# Patient Record
Sex: Female | Born: 1955
Health system: Southern US, Community
[De-identification: ages and names within clinical notes are randomized; demographics above are authoritative.]

## PROBLEM LIST (undated history)

## (undated) DIAGNOSIS — H1010 Acute atopic conjunctivitis, unspecified eye: Secondary | ICD-10-CM

## (undated) DIAGNOSIS — N3281 Overactive bladder: Secondary | ICD-10-CM

## (undated) DIAGNOSIS — F988 Other specified behavioral and emotional disorders with onset usually occurring in childhood and adolescence: Secondary | ICD-10-CM

## (undated) DIAGNOSIS — R519 Headache, unspecified: Secondary | ICD-10-CM

## (undated) DIAGNOSIS — I10 Essential (primary) hypertension: Secondary | ICD-10-CM

## (undated) DIAGNOSIS — M545 Low back pain: Secondary | ICD-10-CM

## (undated) DIAGNOSIS — R32 Unspecified urinary incontinence: Secondary | ICD-10-CM

## (undated) DIAGNOSIS — Z0000 Encounter for general adult medical examination without abnormal findings: Principal | ICD-10-CM

## (undated) DIAGNOSIS — R51 Headache: Secondary | ICD-10-CM

## (undated) DIAGNOSIS — J209 Acute bronchitis, unspecified: Secondary | ICD-10-CM

## (undated) DIAGNOSIS — F418 Other specified anxiety disorders: Secondary | ICD-10-CM

## (undated) DIAGNOSIS — E782 Mixed hyperlipidemia: Secondary | ICD-10-CM

## (undated) DIAGNOSIS — F32A Depression, unspecified: Secondary | ICD-10-CM

## (undated) DIAGNOSIS — F329 Major depressive disorder, single episode, unspecified: Secondary | ICD-10-CM

## (undated) HISTORY — DX: Headache, unspecified: R51.9

## (undated) HISTORY — DX: Unspecified urinary incontinence: R32

## (undated) HISTORY — DX: Other specified behavioral and emotional disorders with onset usually occurring in childhood and adolescence: F98.8

## (undated) HISTORY — DX: Other specified anxiety disorders: F41.8

## (undated) HISTORY — DX: Major depressive disorder, single episode, unspecified: F32.9

## (undated) HISTORY — DX: Acute bronchitis, unspecified: J20.9

## (undated) HISTORY — DX: Overactive bladder: N32.81

## (undated) HISTORY — DX: Essential (primary) hypertension: I10

## (undated) HISTORY — DX: Headache: R51

## (undated) HISTORY — DX: Mixed hyperlipidemia: E78.2

## (undated) HISTORY — DX: Acute atopic conjunctivitis, unspecified eye: H10.10

## (undated) HISTORY — DX: Depression, unspecified: F32.A

## (undated) HISTORY — DX: Low back pain: M54.5

## (undated) HISTORY — DX: Encounter for general adult medical examination without abnormal findings: Z00.00

---

## 2003-11-10 ENCOUNTER — Ambulatory Visit (HOSPITAL_COMMUNITY): Admission: RE | Admit: 2003-11-10 | Discharge: 2003-11-10 | Payer: Self-pay | Admitting: Gastroenterology

## 2004-03-05 ENCOUNTER — Other Ambulatory Visit: Admission: RE | Admit: 2004-03-05 | Discharge: 2004-03-05 | Payer: Self-pay | Admitting: Family Medicine

## 2004-08-22 HISTORY — PX: BREAST BIOPSY: SHX20

## 2005-04-06 ENCOUNTER — Other Ambulatory Visit: Admission: RE | Admit: 2005-04-06 | Discharge: 2005-04-06 | Payer: Self-pay | Admitting: Family Medicine

## 2005-11-15 ENCOUNTER — Encounter: Admission: RE | Admit: 2005-11-15 | Discharge: 2005-11-15 | Payer: Self-pay | Admitting: Family Medicine

## 2007-01-30 ENCOUNTER — Other Ambulatory Visit: Admission: RE | Admit: 2007-01-30 | Discharge: 2007-01-30 | Payer: Self-pay | Admitting: Family Medicine

## 2010-05-18 ENCOUNTER — Ambulatory Visit: Payer: Self-pay | Admitting: Family Medicine

## 2010-05-18 DIAGNOSIS — M25569 Pain in unspecified knee: Secondary | ICD-10-CM | POA: Insufficient documentation

## 2010-09-21 NOTE — Progress Notes (Signed)
Summary: Office Visit Notes  Office Visit Notes   Imported By: Darius Bump 07/08/2010 09:56:46  _____________________________________________________________________  External Attachment:    Type:   Image     Comment:   External Document

## 2010-09-21 NOTE — Miscellaneous (Signed)
Summary: Informed Consent Form  Informed Consent Form   Imported By: Darius Bump 07/08/2010 10:00:00  _____________________________________________________________________  External Attachment:    Type:   Image     Comment:   External Document

## 2010-09-21 NOTE — Assessment & Plan Note (Signed)
Summary: NP KNEE PAIN/NEGATIVE XRAYS   Vital Signs:  Patient profile:   55 year old female Height:      63 inches Weight:      144 pounds BMI:     25.60  Requesting Provider:  Dr. Luz Brazen Primary Care Provider:  Dr. Luz Brazen   History of Present Illness: 55 yo F here with left knee pain  Patient reports no known injury Started developing knee pain about 2 weeks ago that has worsened since then Feels more medially than laterally Associated with swelling in front and back of knee. Stiff especially when using stairs (downstairs) and sitting for prolonged periods. No prior knee issues. No true catching or locking. Has given out on her. Tried aleve once. Has a knee sleeve as well that felt pretty good. Radiographs standing views of left knee with sunrise show no evidence of DJD or other bony abnormalities.  Problems Prior to Update: None  Medications Prior to Update: 1)  None  Allergies (verified): 1)  ! * Latex 2)  ! 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  No bruising or erythema.  Fullness posteriorly but no obvious bakers cyst. TTP medial > lateral joint line reproducing pain. FROM but pain at full flexion and 'feels tight' Negative ant/post drawer.  neg lachmanns Stable to valgus and varus stress. Mcmurrays + medially. 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  X-ray report (weightbearing films) without DJD.  Will try conservative approach given she does not  have any mechanical symptoms (catching/locking) with full extension.  given intraarticular cortisone injection today after written consent.  To start aleve twice a day with food.  Ice knee 3-4 times a day.  Knee brace for support and compression provided.  Quad sets and straight leg raises to maintain quad strength and help unload joint.  Handout also provided and gone over with patient.  Answered all questions.  Discussed that if she does not improve over the next few weeks she should call us and we will set up an MRI of her knee to assess meniscus and, if present, refer for discussion of arthroscopic surgery.  After informed written consent patient was seated on the exam table and left knee was prepped with alcohol swab.  Utilizing an anterolateral approach left knee was injected intraarticularly with 1:3 kenalog:marcaine.  Patient tolerated the procedure well without any immediate complications.  Orders: Joint Aspirate / Injection, Large (20610) Patella / Knee brace (G9562)  Patient Instructions: 1)  Your exam is consistent with a degenerative meniscal tear in your left knee. 2)  You were given a cortisone shot in your knee for pain and inflammation. 3)  Ice your knee 3-4 times a day for 15 minutes at a time. 4)  Take aleve 2 tabs twice a day with food for pain and inflammation. 5)  Wear knee brace when up and walking around for support. 6)  If not improving over the next 3-4 weeks, call me - we will do an MRI if this does not calm down. 7)  Try to  minimize squatting, twisting as much as possible over the next month. 8)  Otherwise ok to do activities as tolerated.

## 2011-01-07 NOTE — Op Note (Signed)
NAME:  MISHAL, PROBERT                       ACCOUNT NO.:  [id number]   MEDICAL RECORD NO.:  [id number]                   PATIENT TYPE:  AMB   LOCATION:  ENDO                                 FACILITY[facility]  [facility]   PHYSICIAN:  Danise Edge, M.D.                DATE OF BIRTH:  1956-06-16   DATE OF PROCEDURE:  11/10/2003  DATE OF DISCHARGE:                                 OPERATIVE REPORT   PROCEDURE:  Diagnostic colonoscopy.   INDICATIONS:  Mrs. Ziggy Reveles is 55 year old female, born April 02, 1956.  Shortly after ordering and consuming an energy drink advertised on  television, Mrs. Fournet developed constipation.  Her normal bowel movement  pattern is one bowel movement a day, usually after her breakfast with  coffee.  She is now having a bowel movement every third day associated with  occasional urges to have a bowel movement without actually passing stool.  She was experiencing abdominal bloating and anorectal pain without  gastrointestinal bleeding.   Mrs. Gravlin's mother was diagnosed colon cancer at age 38 and her 53-year-  old sister was diagnosed with colon cancer.  A sister two years older than  Mrs. Scritchfield did not have colon polyps or colon cancer.  Her younger sister  had colon polyps removed.   ALLERGIES:  Shellfish.   CURRENT MEDICATIONS:  MiraLax.   PAST MEDICAL HISTORY:  1. Acne.  2. Hospitalization for childbirth on three occasions.   HABITS:  Mrs. Byington does not smoke cigarettes or consume alcohol.   SOCIAL HISTORY:  Mrs. Saia's 23 year old husband is in good health.  Her  two daughters and son are in good health.   ENDOSCOPIST:  Danise Edge, M.D.   PREMEDICATION:  Versed 10 mg, Demerol 70 mg.   DESCRIPTION OF PROCEDURE:  After obtaining informed consent, Mrs. Blatchford was  placed in the left lateral decubitus position.  I administered intravenous  Demerol and intravenous Versed to achieve conscious sedation for the  procedure.  The patient's  blood pressure, oxygen saturation and cardiac  rhythm were monitored throughout the procedure and documented in the medical  record.   Anal inspection was normal.  Digital rectal exam was normal.  The Olympus  adjustable pediatric colonoscope was introduced into the rectum and advanced  to the cecum.  Colonic preparation for the exam today was excellent.   Rectum:  Normal.  Sigmoid colon and descending colon:  Normal.  Splenic flexure:  Normal.  Transverse colon:  Normal.  Hepatic flexure:  Normal.  Ascending colon:  Normal.  Cecum and ileocecal valve:  Normal.   ASSESSMENT:  Normal screening proctocolonoscopy to the cecum.  No endoscopic  evidence for the presence of colorectal  neoplasia.   RECOMMENDATIONS:  Repeat colonoscopy in five years.  Continue MiraLax daily  to prevent constipation.  Danise Edge, M.D.    MJ/MEDQ  D:  11/10/2003  T:  11/10/2003  Job:  [id number]   cc:   Vikki Ports, M.D.  [address]  [place]  [place] [address]  Fax: [phone]

## 2011-04-12 ENCOUNTER — Emergency Department (HOSPITAL_COMMUNITY)
Admission: EM | Admit: 2011-04-12 | Discharge: 2011-04-12 | Disposition: A | Discharge status: Home / Self Care | Payer: BC Managed Care – PPO | Attending: Emergency Medicine | Admitting: Emergency Medicine

## 2011-04-12 ENCOUNTER — Emergency Department (HOSPITAL_COMMUNITY): Payer: BC Managed Care – PPO

## 2011-04-12 DIAGNOSIS — R079 Chest pain, unspecified: Secondary | ICD-10-CM | POA: Insufficient documentation

## 2011-04-12 LAB — CBC
HCT: 43.2 % (ref 36.0–46.0)
Hemoglobin: 14.1 g/dL (ref 12.0–15.0)
MCH: 27.3 pg (ref 26.0–34.0)
MCHC: 32.6 g/dL (ref 30.0–36.0)
Platelets: 233 10*3/uL (ref 150–400)
RDW: 13.6 % (ref 11.5–15.5)
WBC: 6.5 10*3/uL (ref 4.0–10.5)

## 2011-04-12 LAB — D-DIMER, QUANTITATIVE: D-Dimer, Quant: <0.22 ug/mL-FEU (ref 0.00–0.48)

## 2011-04-12 LAB — BASIC METABOLIC PANEL
Creatinine, Ser: 0.82 mg/dL (ref 0.50–1.10)
Sodium: 137 mEq/L (ref 135–145)

## 2011-04-12 LAB — DIFFERENTIAL
Basophils Absolute: 0 10*3/uL (ref 0.0–0.1)
Eosinophils Absolute: 0.2 10*3/uL (ref 0.0–0.7)
Eosinophils Relative: 3 % (ref 0–5)
Monocytes Absolute: 0.7 10*3/uL (ref 0.1–1.0)

## 2011-04-12 LAB — POCT I-STAT TROPONIN I

## 2011-12-20 ENCOUNTER — Other Ambulatory Visit: Payer: Self-pay | Admitting: Family Medicine

## 2011-12-20 DIAGNOSIS — Z1231 Encounter for screening mammogram for malignant neoplasm of breast: Secondary | ICD-10-CM

## 2011-12-26 ENCOUNTER — Ambulatory Visit
Admission: RE | Admit: 2011-12-26 | Discharge: 2011-12-26 | Disposition: A | Discharge status: Home / Self Care | Payer: Commercial Indemnity | Source: Ambulatory Visit | Attending: Family Medicine | Admitting: Family Medicine

## 2011-12-26 DIAGNOSIS — Z1231 Encounter for screening mammogram for malignant neoplasm of breast: Secondary | ICD-10-CM

## 2013-10-31 ENCOUNTER — Encounter (HOSPITAL_COMMUNITY): Payer: Self-pay | Admitting: Emergency Medicine

## 2013-10-31 ENCOUNTER — Emergency Department (HOSPITAL_COMMUNITY): Payer: Commercial Indemnity

## 2013-10-31 ENCOUNTER — Emergency Department (HOSPITAL_COMMUNITY)
Admission: EM | Admit: 2013-10-31 | Discharge: 2013-10-31 | Disposition: A | Discharge status: Home / Self Care | Payer: Commercial Indemnity | Attending: Emergency Medicine | Admitting: Emergency Medicine

## 2013-10-31 DIAGNOSIS — Z880 Allergy status to penicillin: Secondary | ICD-10-CM | POA: Insufficient documentation

## 2013-10-31 DIAGNOSIS — S161XXA Strain of muscle, fascia and tendon at neck level, initial encounter: Secondary | ICD-10-CM

## 2013-10-31 DIAGNOSIS — S335XXA Sprain of ligaments of lumbar spine, initial encounter: Secondary | ICD-10-CM | POA: Insufficient documentation

## 2013-10-31 DIAGNOSIS — Z91040 Latex allergy status: Secondary | ICD-10-CM | POA: Insufficient documentation

## 2013-10-31 DIAGNOSIS — Y92410 Unspecified street and highway as the place of occurrence of the external cause: Secondary | ICD-10-CM | POA: Insufficient documentation

## 2013-10-31 DIAGNOSIS — S301XXA Contusion of abdominal wall, initial encounter: Secondary | ICD-10-CM | POA: Insufficient documentation

## 2013-10-31 DIAGNOSIS — Z79899 Other long term (current) drug therapy: Secondary | ICD-10-CM | POA: Insufficient documentation

## 2013-10-31 DIAGNOSIS — S139XXA Sprain of joints and ligaments of unspecified parts of neck, initial encounter: Secondary | ICD-10-CM | POA: Insufficient documentation

## 2013-10-31 DIAGNOSIS — S39012A Strain of muscle, fascia and tendon of lower back, initial encounter: Secondary | ICD-10-CM

## 2013-10-31 DIAGNOSIS — Y9389 Activity, other specified: Secondary | ICD-10-CM | POA: Insufficient documentation

## 2013-10-31 LAB — CBC WITH DIFFERENTIAL/PLATELET
BASOS PCT: 1 % (ref 0–1)
Basophils Absolute: 0 10*3/uL (ref 0.0–0.1)
Eosinophils Absolute: 0 10*3/uL (ref 0.0–0.7)
Eosinophils Relative: 1 % (ref 0–5)
HEMATOCRIT: 43 % (ref 36.0–46.0)
HEMOGLOBIN: 14.8 g/dL (ref 12.0–15.0)
LYMPHS PCT: 45 % (ref 12–46)
Lymphs Abs: 2.1 10*3/uL (ref 0.7–4.0)
MCH: 28.2 pg (ref 26.0–34.0)
MCHC: 34.4 g/dL (ref 30.0–36.0)
MCV: 81.9 fL (ref 78.0–100.0)
Monocytes Absolute: 0.3 10*3/uL (ref 0.1–1.0)
Monocytes Relative: 7 % (ref 3–12)
NEUTROS PCT: 46 % (ref 43–77)
Neutro Abs: 2.1 10*3/uL (ref 1.7–7.7)
Platelets: 215 10*3/uL (ref 150–400)
RBC: 5.25 MIL/uL — AB (ref 3.87–5.11)
RDW: 13 % (ref 11.5–15.5)
WBC: 4.6 10*3/uL (ref 4.0–10.5)

## 2013-10-31 LAB — I-STAT CHEM 8, ED
BUN: 7 mg/dL (ref 6–23)
CHLORIDE: 102 meq/L (ref 96–112)
CREATININE: 0.9 mg/dL (ref 0.50–1.10)
Calcium, Ion: 1.24 mmol/L — ABNORMAL HIGH (ref 1.12–1.23)
GLUCOSE: 93 mg/dL (ref 70–99)
HCT: 49 % — ABNORMAL HIGH (ref 36.0–46.0)
Hemoglobin: 16.7 g/dL — ABNORMAL HIGH (ref 12.0–15.0)
Potassium: 3.8 mEq/L (ref 3.7–5.3)
SODIUM: 143 meq/L (ref 137–147)
TCO2: 27 mmol/L (ref 0–100)

## 2013-10-31 MED ORDER — ONDANSETRON HCL 4 MG/2ML IJ SOLN
4.0000 mg | Freq: Once | INTRAMUSCULAR | Status: AC
  Administered 2013-10-31: 4 mg via INTRAVENOUS
  Filled 2013-10-31: qty 2

## 2013-10-31 MED ORDER — CYCLOBENZAPRINE HCL 10 MG PO TABS: 10.0000 mg | ORAL_TABLET | Freq: Two times a day (BID) | ORAL | Status: DC | PRN

## 2013-10-31 MED ORDER — IBUPROFEN 600 MG PO TABS: 600.0000 mg | ORAL_TABLET | Freq: Four times a day (QID) | ORAL | Status: DC | PRN

## 2013-10-31 MED ORDER — HYDROCODONE-ACETAMINOPHEN 5-325 MG PO TABS: 1.0000 | ORAL_TABLET | Freq: Four times a day (QID) | ORAL | Status: DC | PRN

## 2013-10-31 MED ORDER — MORPHINE SULFATE 4 MG/ML IJ SOLN
4.0000 mg | Freq: Once | INTRAMUSCULAR | Status: AC
  Administered 2013-10-31: 4 mg via INTRAVENOUS
  Filled 2013-10-31: qty 1

## 2013-10-31 MED ORDER — IOHEXOL 300 MG/ML  SOLN
100.0000 mL | Freq: Once | INTRAMUSCULAR | Status: AC | PRN
  Administered 2013-10-31: 100 mL via INTRAVENOUS
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
## 2013-11-01 NOTE — ED Provider Notes (Signed)
Medical screening examination/treatment/procedure(s) were performed by non-physician practitioner and as supervising physician I was immediately available for consultation/collaboration.   EKG Interpretation None        Suzanne ArgyleForrest S Rogerick Baldwin, MD 11/01/13 [phone]

## 2014-05-13 ENCOUNTER — Encounter: Payer: Self-pay | Admitting: Women's Health

## 2014-05-13 ENCOUNTER — Other Ambulatory Visit (HOSPITAL_COMMUNITY)
Admission: RE | Admit: 2014-05-13 | Discharge: 2014-05-13 | Disposition: A | Discharge status: Home / Self Care | Payer: Commercial Indemnity | Source: Ambulatory Visit | Attending: Women's Health | Admitting: Women's Health

## 2014-05-13 ENCOUNTER — Ambulatory Visit (INDEPENDENT_AMBULATORY_CARE_PROVIDER_SITE_OTHER): Payer: Managed Care, Other (non HMO) | Admitting: Women's Health

## 2014-05-13 VITALS — BP 140/84 | Ht 63.0 in | Wt 140.0 lb

## 2014-05-13 DIAGNOSIS — Z113 Encounter for screening for infections with a predominantly sexual mode of transmission: Secondary | ICD-10-CM

## 2014-05-13 DIAGNOSIS — Z01419 Encounter for gynecological examination (general) (routine) without abnormal findings: Secondary | ICD-10-CM | POA: Diagnosis present

## 2014-05-13 DIAGNOSIS — Z780 Asymptomatic menopausal state: Secondary | ICD-10-CM

## 2014-05-13 DIAGNOSIS — E079 Disorder of thyroid, unspecified: Secondary | ICD-10-CM

## 2014-05-13 DIAGNOSIS — R109 Unspecified abdominal pain: Secondary | ICD-10-CM

## 2014-05-13 DIAGNOSIS — E559 Vitamin D deficiency, unspecified: Secondary | ICD-10-CM

## 2014-05-13 DIAGNOSIS — Z1151 Encounter for screening for human papillomavirus (HPV): Secondary | ICD-10-CM | POA: Insufficient documentation

## 2014-05-13 NOTE — Patient Instructions (Signed)
Health Recommendations for Postmenopausal Women Respected and ongoing research has looked at the most common causes of death, disability, and poor quality of life in postmenopausal women. The causes include heart disease, diseases of blood vessels, diabetes, depression, cancer, and bone loss (osteoporosis). Many things can be done to help lower the chances of developing these and other common problems. CARDIOVASCULAR DISEASE Heart Disease: A heart attack is a medical emergency. Know the signs and symptoms of a heart attack. Below are things women can do to reduce their risk for heart disease.   Do not smoke. If you smoke, quit.  Aim for a healthy weight. Being overweight causes many preventable deaths. Eat a healthy and balanced diet and drink an adequate amount of liquids.  Get moving. Make a commitment to be more physically active. Aim for 30 minutes of activity on most, if not all days of the week.  Eat for heart health. Choose a diet that is low in saturated fat and cholesterol and eliminate trans fat. Include whole grains, vegetables, and fruits. Read and understand the labels on food containers before buying.  Know your numbers. Ask your caregiver to check your blood pressure, cholesterol (total, HDL, LDL, triglycerides) and blood glucose. Work with your caregiver on improving your entire clinical picture.  High blood pressure. Limit or stop your table salt intake (try salt substitute and food seasonings). Avoid salty foods and drinks. Read labels on food containers before buying. Eating well and exercising can help control high blood pressure. STROKE  Stroke is a medical emergency. Stroke may be the result of a blood clot in a blood vessel in the brain or by a brain hemorrhage (bleeding). Know the signs and symptoms of a stroke. To lower the risk of developing a stroke:  Avoid fatty foods.  Quit smoking.  Control your diabetes, blood pressure, and irregular heart rate. THROMBOPHLEBITIS  (BLOOD CLOT) OF THE LEG  Becoming overweight and leading a stationary lifestyle may also contribute to developing blood clots. Controlling your diet and exercising will help lower the risk of developing blood clots. CANCER SCREENING  Breast Cancer: Take steps to reduce your risk of breast cancer.  You should practice "breast self-awareness." This means understanding the normal appearance and feel of your breasts and should include breast self-examination. Any changes detected, no matter how small, should be reported to your caregiver.  After age 40, you should have a clinical breast exam (CBE) every year.  Starting at age 40, you should consider having a mammogram (breast X-ray) every year.  If you have a family history of breast cancer, talk to your caregiver about genetic screening.  If you are at high risk for breast cancer, talk to your caregiver about having an MRI and a mammogram every year.  Intestinal or Stomach Cancer: Tests to consider are a rectal exam, fecal occult blood, sigmoidoscopy, and colonoscopy. Women who are high risk may need to be screened at an earlier age and more often.  Cervical Cancer:  Beginning at age 30, you should have a Pap test every 3 years as long as the past 3 Pap tests have been normal.  If you have had past treatment for cervical cancer or a condition that could lead to cancer, you need Pap tests and screening for cancer for at least 20 years after your treatment.  If you had a hysterectomy for a problem that was not cancer or a condition that could lead to cancer, then you no longer need Pap tests.    If you are between ages 65 and 70, and you have had normal Pap tests going back 10 years, you no longer need Pap tests.  If Pap tests have been discontinued, risk factors (such as a new sexual partner) need to be reassessed to determine if screening should be resumed.  Some medical problems can increase the chance of getting cervical cancer. In these  cases, your caregiver may recommend more frequent screening and Pap tests.  Uterine Cancer: If you have vaginal bleeding after reaching menopause, you should notify your caregiver.  Ovarian Cancer: Other than yearly pelvic exams, there are no reliable tests available to screen for ovarian cancer at this time except for yearly pelvic exams.  Lung Cancer: Yearly chest X-rays can detect lung cancer and should be done on high risk women, such as cigarette smokers and women with chronic lung disease (emphysema).  Skin Cancer: A complete body skin exam should be done at your yearly examination. Avoid overexposure to the sun and ultraviolet light lamps. Use a strong sun block cream when in the sun. All of these things are important for lowering the risk of skin cancer. MENOPAUSE Menopause Symptoms: Hormone therapy products are effective for treating symptoms associated with menopause:  Moderate to severe hot flashes.  Night sweats.  Mood swings.  Headaches.  Tiredness.  Loss of sex drive.  Insomnia.  Other symptoms. Hormone replacement carries certain risks, especially in older women. Women who use or are thinking about using estrogen or estrogen with progestin treatments should discuss that with their caregiver. Your caregiver will help you understand the benefits and risks. The ideal dose of hormone replacement therapy is not known. The Food and Drug Administration (FDA) has concluded that hormone therapy should be used only at the lowest doses and for the shortest amount of time to reach treatment goals.  OSTEOPOROSIS Protecting Against Bone Loss and Preventing Fracture If you use hormone therapy for prevention of bone loss (osteoporosis), the risks for bone loss must outweigh the risk of the therapy. Ask your caregiver about other medications known to be safe and effective for preventing bone loss and fractures. To guard against bone loss or fractures, the following is recommended:  If  you are younger than age 50, take 1000 mg of calcium and at least 600 mg of Vitamin D per day.  If you are older than age 50 but younger than age 70, take 1200 mg of calcium and at least 600 mg of Vitamin D per day.  If you are older than age 70, take 1200 mg of calcium and at least 800 mg of Vitamin D per day. Smoking and excessive alcohol intake increases the risk of osteoporosis. Eat foods rich in calcium and vitamin D and do weight bearing exercises several times a week as your caregiver suggests. DIABETES Diabetes Mellitus: If you have type I or type 2 diabetes, you should keep your blood sugar under control with diet, exercise, and recommended medication. Avoid starchy and fatty foods, and too many sweets. Being overweight can make diabetes control more difficult. COGNITION AND MEMORY Cognition and Memory: Menopausal hormone therapy is not recommended for the prevention of cognitive disorders such as Alzheimer's disease or memory loss.  DEPRESSION  Depression may occur at any age, but it is common in elderly women. This may be because of physical, medical, social (loneliness), or financial problems and needs. If you are experiencing depression because of medical problems and control of symptoms, talk to your caregiver about this. Physical   activity and exercise may help with mood and sleep. Community and volunteer involvement may improve your sense of value and worth. If you have depression and you feel that the problem is getting worse or becoming severe, talk to your caregiver about which treatment options are best for you. ACCIDENTS  Accidents are common and can be serious in elderly woman. Prepare your house to prevent accidents. Eliminate throw rugs, place hand bars in bath, shower, and toilet areas. Avoid wearing high heeled shoes or walking on wet, snowy, and icy areas. Limit or stop driving if you have vision or hearing problems, or if you feel you are unsteady with your movements and  reflexes. HEPATITIS C Hepatitis C is a type of viral infection affecting the liver. It is spread mainly through contact with blood from an infected person. It can be treated, but if left untreated, it can lead to severe liver damage over the years. Many people who are infected do not know that the virus is in their blood. If you are a "baby-boomer", it is recommended that you have one screening test for Hepatitis C. IMMUNIZATIONS  Several immunizations are important to consider having during your senior years, including:   Tetanus, diphtheria, and pertussis booster shot.  Influenza every year before the flu season begins.  Pneumonia vaccine.  Shingles vaccine.  Others, as indicated based on your specific needs. Talk to your caregiver about these. Document Released: 09/30/2005 Document Revised: 12/23/2013 Document Reviewed: 05/26/2008 [employer] Patient Information 2015 [employer]. This information is not intended to replace advice given to you by your health care provider. Make sure you discuss any questions you have with your health care provider.  
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
## 2014-05-14 LAB — VITAMIN D 25 HYDROXY (VIT D DEFICIENCY, FRACTURES): Vit D, 25-Hydroxy: 29 ng/mL — ABNORMAL LOW (ref 30–89)

## 2014-05-14 LAB — GC/CHLAMYDIA PROBE AMP
CT PROBE, AMP APTIMA: NEGATIVE
GC PROBE AMP APTIMA: NEGATIVE

## 2014-05-14 LAB — RPR

## 2014-05-14 LAB — HEPATITIS C ANTIBODY: HCV AB: NEGATIVE

## 2014-05-14 LAB — HIV ANTIBODY (ROUTINE TESTING W REFLEX): HIV: NONREACTIVE

## 2014-05-14 LAB — HEPATITIS B SURFACE ANTIGEN: Hepatitis B Surface Ag: NEGATIVE

## 2014-05-15 LAB — CYTOLOGY - PAP

## 2014-05-16 ENCOUNTER — Other Ambulatory Visit: Payer: Self-pay | Admitting: *Deleted

## 2014-05-16 DIAGNOSIS — E559 Vitamin D deficiency, unspecified: Secondary | ICD-10-CM

## 2014-05-16 MED ORDER — VITAMIN D (ERGOCALCIFEROL) 1.25 MG (50000 UNIT) PO CAPS: 50000.0000 [IU] | ORAL_CAPSULE | ORAL | Status: DC

## 2014-12-01 ENCOUNTER — Encounter: Payer: Self-pay | Admitting: Family Medicine

## 2014-12-01 ENCOUNTER — Ambulatory Visit: Payer: Commercial Indemnity | Admitting: Family Medicine

## 2014-12-01 ENCOUNTER — Ambulatory Visit (HOSPITAL_BASED_OUTPATIENT_CLINIC_OR_DEPARTMENT_OTHER)
Admission: RE | Admit: 2014-12-01 | Discharge: 2014-12-01 | Disposition: A | Discharge status: Home / Self Care | Payer: Commercial Indemnity | Source: Ambulatory Visit | Attending: Family Medicine | Admitting: Family Medicine

## 2014-12-01 ENCOUNTER — Ambulatory Visit (INDEPENDENT_AMBULATORY_CARE_PROVIDER_SITE_OTHER): Payer: Commercial Indemnity | Admitting: Family Medicine

## 2014-12-01 VITALS — BP 114/80 | HR 61 | Temp 97.7°F | Ht 62.5 in | Wt 146.0 lb

## 2014-12-01 DIAGNOSIS — R32 Unspecified urinary incontinence: Secondary | ICD-10-CM

## 2014-12-01 DIAGNOSIS — I10 Essential (primary) hypertension: Secondary | ICD-10-CM

## 2014-12-01 DIAGNOSIS — R51 Headache: Secondary | ICD-10-CM

## 2014-12-01 DIAGNOSIS — M5032 Other cervical disc degeneration, mid-cervical region: Secondary | ICD-10-CM | POA: Diagnosis not present

## 2014-12-01 DIAGNOSIS — N760 Acute vaginitis: Secondary | ICD-10-CM

## 2014-12-01 DIAGNOSIS — N390 Urinary tract infection, site not specified: Secondary | ICD-10-CM | POA: Diagnosis not present

## 2014-12-01 DIAGNOSIS — F988 Other specified behavioral and emotional disorders with onset usually occurring in childhood and adolescence: Secondary | ICD-10-CM

## 2014-12-01 DIAGNOSIS — M5033 Other cervical disc degeneration, cervicothoracic region: Secondary | ICD-10-CM | POA: Diagnosis not present

## 2014-12-01 DIAGNOSIS — R519 Headache, unspecified: Secondary | ICD-10-CM

## 2014-12-01 DIAGNOSIS — F418 Other specified anxiety disorders: Secondary | ICD-10-CM

## 2014-12-01 DIAGNOSIS — F909 Attention-deficit hyperactivity disorder, unspecified type: Secondary | ICD-10-CM

## 2014-12-01 DIAGNOSIS — R202 Paresthesia of skin: Secondary | ICD-10-CM | POA: Diagnosis not present

## 2014-12-01 DIAGNOSIS — M542 Cervicalgia: Secondary | ICD-10-CM | POA: Diagnosis not present

## 2014-12-01 LAB — URINALYSIS, ROUTINE W REFLEX MICROSCOPIC
Bilirubin Urine: NEGATIVE
Hgb urine dipstick: NEGATIVE
Ketones, ur: NEGATIVE
NITRITE: NEGATIVE
Specific Gravity, Urine: 1.02 (ref 1.000–1.030)
Total Protein, Urine: NEGATIVE
Urine Glucose: NEGATIVE
Urobilinogen, UA: 0.2 (ref 0.0–1.0)
pH: 6.5 (ref 5.0–8.0)

## 2014-12-01 LAB — VITAMIN B12: Vitamin B-12: 750 pg/mL (ref 211–911)

## 2014-12-01 MED ORDER — OXYBUTYNIN CHLORIDE 5 MG PO TABS: 5.0000 mg | ORAL_TABLET | Freq: Every day | ORAL | Status: DC | PRN

## 2014-12-01 MED ORDER — AMPHETAMINE-DEXTROAMPHETAMINE 5 MG PO TABS: 5.0000 mg | ORAL_TABLET | Freq: Two times a day (BID) | ORAL | Status: DC

## 2014-12-01 MED ORDER — HYDROCHLOROTHIAZIDE 12.5 MG PO CAPS: 12.5000 mg | ORAL_CAPSULE | ORAL | Status: DC | PRN

## 2014-12-01 MED ORDER — CITALOPRAM HYDROBROMIDE 40 MG PO TABS: 40.0000 mg | ORAL_TABLET | Freq: Every day | ORAL | Status: DC

## 2014-12-01 MED ORDER — CITALOPRAM HYDROBROMIDE 20 MG PO TABS: 20.0000 mg | ORAL_TABLET | Freq: Every day | ORAL | Status: DC

## 2014-12-01 MED ORDER — CYCLOBENZAPRINE HCL 10 MG PO TABS: 10.0000 mg | ORAL_TABLET | Freq: Two times a day (BID) | ORAL | Status: DC | PRN

## 2014-12-01 NOTE — Patient Instructions (Signed)
Preventive Care for Adults A healthy lifestyle and preventive care can promote health and wellness. Preventive health guidelines for women include the following key practices.  A routine yearly physical is a good way to check with your health care provider about your health and preventive screening. It is a chance to share any concerns and updates on your health and to receive a thorough exam.  Visit your dentist for a routine exam and preventive care every 6 months. Brush your teeth twice a day and floss once a day. Good oral hygiene prevents tooth decay and gum disease.  The frequency of eye exams is based on your age, health, family medical history, use of contact lenses, and other factors. Follow your health care provider's recommendations for frequency of eye exams.  Eat a healthy diet. Foods like vegetables, fruits, whole grains, low-fat dairy products, and lean protein foods contain the nutrients you need without too many calories. Decrease your intake of foods high in solid fats, added sugars, and salt. Eat the right amount of calories for you.Get information about a proper diet from your health care provider, if necessary.  Regular physical exercise is one of the most important things you can do for your health. Most adults should get at least 150 minutes of moderate-intensity exercise (any activity that increases your heart rate and causes you to sweat) each week. In addition, most adults need muscle-strengthening exercises on 2 or more days a week.  Maintain a healthy weight. The body mass index (BMI) is a screening tool to identify possible weight problems. It provides an estimate of body fat based on height and weight. Your health care provider can find your BMI and can help you achieve or maintain a healthy weight.For adults 20 years and older:  A BMI below 18.5 is considered underweight.  A BMI of 18.5 to 24.9 is normal.  A BMI of 25 to 29.9 is considered overweight.  A BMI of  30 and above is considered obese.  Maintain normal blood lipids and cholesterol levels by exercising and minimizing your intake of saturated fat. Eat a balanced diet with plenty of fruit and vegetables. Blood tests for lipids and cholesterol should begin at age 76 and be repeated every 5 years. If your lipid or cholesterol levels are high, you are over 50, or you are at high risk for heart disease, you may need your cholesterol levels checked more frequently.Ongoing high lipid and cholesterol levels should be treated with medicines if diet and exercise are not working.  If you smoke, find out from your health care provider how to quit. If you do not use tobacco, do not start.  Lung cancer screening is recommended for adults aged 22-80 years who are at high risk for developing lung cancer because of a history of smoking. A yearly low-dose CT scan of the lungs is recommended for people who have at least a 30-pack-year history of smoking and are a current smoker or have quit within the past 15 years. A pack year of smoking is smoking an average of 1 pack of cigarettes a day for 1 year (for example: 1 pack a day for 30 years or 2 packs a day for 15 years). Yearly screening should continue until the smoker has stopped smoking for at least 15 years. Yearly screening should be stopped for people who develop a health problem that would prevent them from having lung cancer treatment.  If you are pregnant, do not drink alcohol. If you are breastfeeding,  be very cautious about drinking alcohol. If you are not pregnant and choose to drink alcohol, do not have more than 1 drink per day. One drink is considered to be 12 ounces (355 mL) of beer, 5 ounces (148 mL) of wine, or 1.5 ounces (44 mL) of liquor.  Avoid use of street drugs. Do not share needles with anyone. Ask for help if you need support or instructions about stopping the use of drugs.  High blood pressure causes heart disease and increases the risk of  stroke. Your blood pressure should be checked at least every 1 to 2 years. Ongoing high blood pressure should be treated with medicines if weight loss and exercise do not work.  If you are 27-57 years old, ask your health care provider if you should take aspirin to prevent strokes.  Diabetes screening involves taking a blood sample to check your fasting blood sugar level. This should be done once every 3 years, after age 53, if you are within normal weight and without risk factors for diabetes. Testing should be considered at a younger age or be carried out more frequently if you are overweight and have at least 1 risk factor for diabetes.  Breast cancer screening is essential preventive care for women. You should practice "breast self-awareness." This means understanding the normal appearance and feel of your breasts and may include breast self-examination. Any changes detected, no matter how small, should be reported to a health care provider. Women in their 78s and 30s should have a clinical breast exam (CBE) by a health care provider as part of a regular health exam every 1 to 3 years. After age 77, women should have a CBE every year. Starting at age 22, women should consider having a mammogram (breast X-ray test) every year. Women who have a family history of breast cancer should talk to their health care provider about genetic screening. Women at a high risk of breast cancer should talk to their health care providers about having an MRI and a mammogram every year.  Breast cancer gene (BRCA)-related cancer risk assessment is recommended for women who have family members with BRCA-related cancers. BRCA-related cancers include breast, ovarian, tubal, and peritoneal cancers. Having family members with these cancers may be associated with an increased risk for harmful changes (mutations) in the breast cancer genes BRCA1 and BRCA2. Results of the assessment will determine the need for genetic counseling and  BRCA1 and BRCA2 testing.  Routine pelvic exams to screen for cancer are no longer recommended for nonpregnant women who are considered low risk for cancer of the pelvic organs (ovaries, uterus, and vagina) and who do not have symptoms. Ask your health care provider if a screening pelvic exam is right for you.  If you have had past treatment for cervical cancer or a condition that could lead to cancer, you need Pap tests and screening for cancer for at least 20 years after your treatment. If Pap tests have been discontinued, your risk factors (such as having a new sexual partner) need to be reassessed to determine if screening should be resumed. Some women have medical problems that increase the chance of getting cervical cancer. In these cases, your health care provider may recommend more frequent screening and Pap tests.  The HPV test is an additional test that may be used for cervical cancer screening. The HPV test looks for the virus that can cause the cell changes on the cervix. The cells collected during the Pap test can be  tested for HPV. The HPV test could be used to screen women aged 89 years and older, and should be used in women of any age who have unclear Pap test results. After the age of 9, women should have HPV testing at the same frequency as a Pap test.  Colorectal cancer can be detected and often prevented. Most routine colorectal cancer screening begins at the age of 42 years and continues through age 56 years. However, your health care provider may recommend screening at an earlier age if you have risk factors for colon cancer. On a yearly basis, your health care provider may provide home test kits to check for hidden blood in the stool. Use of a small camera at the end of a tube, to directly examine the colon (sigmoidoscopy or colonoscopy), can detect the earliest forms of colorectal cancer. Talk to your health care provider about this at age 34, when routine screening begins. Direct  exam of the colon should be repeated every 5-10 years through age 20 years, unless early forms of pre-cancerous polyps or small growths are found.  People who are at an increased risk for hepatitis B should be screened for this virus. You are considered at high risk for hepatitis B if:  You were born in a country where hepatitis B occurs often. Talk with your health care provider about which countries are considered high risk.  Your parents were born in a high-risk country and you have not received a shot to protect against hepatitis B (hepatitis B vaccine).  You have HIV or AIDS.  You use needles to inject street drugs.  You live with, or have sex with, someone who has hepatitis B.  You get hemodialysis treatment.  You take certain medicines for conditions like cancer, organ transplantation, and autoimmune conditions.  Hepatitis C blood testing is recommended for all people born from [birth year] through 1964 and any individual with known risks for hepatitis C.  Practice safe sex. Use condoms and avoid high-risk sexual practices to reduce the spread of sexually transmitted infections (STIs). STIs include gonorrhea, chlamydia, syphilis, trichomonas, herpes, HPV, and human immunodeficiency virus (HIV). Herpes, HIV, and HPV are viral illnesses that have no cure. They can result in disability, cancer, and death.  You should be screened for sexually transmitted illnesses (STIs) including gonorrhea and chlamydia if:  You are sexually active and are younger than 24 years.  You are older than 24 years and your health care provider tells you that you are at risk for this type of infection.  Your sexual activity has changed since you were last screened and you are at an increased risk for chlamydia or gonorrhea. Ask your health care provider if you are at risk.  If you are at risk of being infected with HIV, it is recommended that you take a prescription medicine daily to prevent HIV infection. This is  called preexposure prophylaxis (PrEP). You are considered at risk if:  You are a heterosexual woman, are sexually active, and are at increased risk for HIV infection.  You take drugs by injection.  You are sexually active with a partner who has HIV.  Talk with your health care provider about whether you are at high risk of being infected with HIV. If you choose to begin PrEP, you should first be tested for HIV. You should then be tested every 3 months for as long as you are taking PrEP.  Osteoporosis is a disease in which the bones lose minerals and strength  with aging. This can result in serious bone fractures or breaks. The risk of osteoporosis can be identified using a bone density scan. Women ages 65 years and over and women at risk for fractures or osteoporosis should discuss screening with their health care providers. Ask your health care provider whether you should take a calcium supplement or vitamin D to reduce the rate of osteoporosis.  Menopause can be associated with physical symptoms and risks. Hormone replacement therapy is available to decrease symptoms and risks. You should talk to your health care provider about whether hormone replacement therapy is right for you.  Use sunscreen. Apply sunscreen liberally and repeatedly throughout the day. You should seek shade when your shadow is shorter than you. Protect yourself by wearing long sleeves, pants, a wide-brimmed hat, and sunglasses year round, whenever you are outdoors.  Once a month, do a whole body skin exam, using a mirror to look at the skin on your back. Tell your health care provider of new moles, moles that have irregular borders, moles that are larger than a pencil eraser, or moles that have changed in shape or color.  Stay current with required vaccines (immunizations).  Influenza vaccine. All adults should be immunized every year.  Tetanus, diphtheria, and acellular pertussis (Td, Tdap) vaccine. Pregnant women should  receive 1 dose of Tdap vaccine during each pregnancy. The dose should be obtained regardless of the length of time since the last dose. Immunization is preferred during the 27th-36th week of gestation. An adult who has not previously received Tdap or who does not know her vaccine status should receive 1 dose of Tdap. This initial dose should be followed by tetanus and diphtheria toxoids (Td) booster doses every 10 years. Adults with an unknown or incomplete history of completing a 3-dose immunization series with Td-containing vaccines should begin or complete a primary immunization series including a Tdap dose. Adults should receive a Td booster every 10 years.  Varicella vaccine. An adult without evidence of immunity to varicella should receive 2 doses or a second dose if she has previously received 1 dose. Pregnant females who do not have evidence of immunity should receive the first dose after pregnancy. This first dose should be obtained before leaving the health care facility. The second dose should be obtained 4-8 weeks after the first dose.  Human papillomavirus (HPV) vaccine. Females aged 13-26 years who have not received the vaccine previously should obtain the 3-dose series. The vaccine is not recommended for use in pregnant females. However, pregnancy testing is not needed before receiving a dose. If a female is found to be pregnant after receiving a dose, no treatment is needed. In that case, the remaining doses should be delayed until after the pregnancy. Immunization is recommended for any person with an immunocompromised condition through the age of 26 years if she did not get any or all doses earlier. During the 3-dose series, the second dose should be obtained 4-8 weeks after the first dose. The third dose should be obtained 24 weeks after the first dose and 16 weeks after the second dose.  Zoster vaccine. One dose is recommended for adults aged 60 years or older unless certain conditions are  present.  Measles, mumps, and rubella (MMR) vaccine. Adults born before 1957 generally are considered immune to measles and mumps. Adults born in 1957 or later should have 1 or more doses of MMR vaccine unless there is a contraindication to the vaccine or there is laboratory evidence of immunity to   each of the three diseases. A routine second dose of MMR vaccine should be obtained at least 28 days after the first dose for students attending postsecondary schools, health care workers, or international travelers. People who received inactivated measles vaccine or an unknown type of measles vaccine during 1963-1967 should receive 2 doses of MMR vaccine. People who received inactivated mumps vaccine or an unknown type of mumps vaccine before 1979 and are at high risk for mumps infection should consider immunization with 2 doses of MMR vaccine. For females of childbearing age, rubella immunity should be determined. If there is no evidence of immunity, females who are not pregnant should be vaccinated. If there is no evidence of immunity, females who are pregnant should delay immunization until after pregnancy. Unvaccinated health care workers born before 1957 who lack laboratory evidence of measles, mumps, or rubella immunity or laboratory confirmation of disease should consider measles and mumps immunization with 2 doses of MMR vaccine or rubella immunization with 1 dose of MMR vaccine.  Pneumococcal 13-valent conjugate (PCV13) vaccine. When indicated, a person who is uncertain of her immunization history and has no record of immunization should receive the PCV13 vaccine. An adult aged 19 years or older who has certain medical conditions and has not been previously immunized should receive 1 dose of PCV13 vaccine. This PCV13 should be followed with a dose of pneumococcal polysaccharide (PPSV23) vaccine. The PPSV23 vaccine dose should be obtained at least 8 weeks after the dose of PCV13 vaccine. An adult aged 19  years or older who has certain medical conditions and previously received 1 or more doses of PPSV23 vaccine should receive 1 dose of PCV13. The PCV13 vaccine dose should be obtained 1 or more years after the last PPSV23 vaccine dose.  Pneumococcal polysaccharide (PPSV23) vaccine. When PCV13 is also indicated, PCV13 should be obtained first. All adults aged 65 years and older should be immunized. An adult younger than age 65 years who has certain medical conditions should be immunized. Any person who resides in a nursing home or long-term care facility should be immunized. An adult smoker should be immunized. People with an immunocompromised condition and certain other conditions should receive both PCV13 and PPSV23 vaccines. People with human immunodeficiency virus (HIV) infection should be immunized as soon as possible after diagnosis. Immunization during chemotherapy or radiation therapy should be avoided. Routine use of PPSV23 vaccine is not recommended for American Indians, [place] Natives, or people younger than 65 years unless there are medical conditions that require PPSV23 vaccine. When indicated, people who have unknown immunization and have no record of immunization should receive PPSV23 vaccine. One-time revaccination 5 years after the first dose of PPSV23 is recommended for people aged 19-64 years who have chronic kidney failure, nephrotic syndrome, asplenia, or immunocompromised conditions. People who received 1-2 doses of PPSV23 before age 65 years should receive another dose of PPSV23 vaccine at age 65 years or later if at least 5 years have passed since the previous dose. Doses of PPSV23 are not needed for people immunized with PPSV23 at or after age 65 years.  Meningococcal vaccine. Adults with asplenia or persistent complement component deficiencies should receive 2 doses of quadrivalent meningococcal conjugate (MenACWY-D) vaccine. The doses should be obtained at least 2 months apart.  Microbiologists working with certain meningococcal bacteria, military recruits, people at risk during an outbreak, and people who travel to or live in countries with a high rate of meningitis should be immunized. A first-year college student up through age   21 years who is living in a residence hall should receive a dose if she did not receive a dose on or after her 16th birthday. Adults who have certain high-risk conditions should receive one or more doses of vaccine.  Hepatitis A vaccine. Adults who wish to be protected from this disease, have certain high-risk conditions, work with hepatitis A-infected animals, work in hepatitis A research labs, or travel to or work in countries with a high rate of hepatitis A should be immunized. Adults who were previously unvaccinated and who anticipate close contact with an international adoptee during the first 60 days after arrival in the [place] from a country with a high rate of hepatitis A should be immunized.  Hepatitis B vaccine. Adults who wish to be protected from this disease, have certain high-risk conditions, may be exposed to blood or other infectious body fluids, are household contacts or sex partners of hepatitis B positive people, are clients or workers in certain care facilities, or travel to or work in countries with a high rate of hepatitis B should be immunized.  Haemophilus influenzae type b (Hib) vaccine. A previously unvaccinated person with asplenia or sickle cell disease or having a scheduled splenectomy should receive 1 dose of Hib vaccine. Regardless of previous immunization, a recipient of a hematopoietic stem cell transplant should receive a 3-dose series 6-12 months after her successful transplant. Hib vaccine is not recommended for adults with HIV infection. Preventive Services / Frequency Ages 64 to 68 years  Blood pressure check.** / Every 1 to 2 years.  Lipid and cholesterol check.** / Every 5 years beginning at age  22.  Clinical breast exam.** / Every 3 years for women in their 88s and 53s.  BRCA-related cancer risk assessment.** / For women who have family members with a BRCA-related cancer (breast, ovarian, tubal, or peritoneal cancers).  Pap test.** / Every 2 years from ages 90 through 51. Every 3 years starting at age 21 through age 56 or 3 with a history of 3 consecutive normal Pap tests.  HPV screening.** / Every 3 years from ages 24 through ages 1 to 46 with a history of 3 consecutive normal Pap tests.  Hepatitis C blood test.** / For any individual with known risks for hepatitis C.  Skin self-exam. / Monthly.  Influenza vaccine. / Every year.  Tetanus, diphtheria, and acellular pertussis (Tdap, Td) vaccine.** / Consult your health care provider. Pregnant women should receive 1 dose of Tdap vaccine during each pregnancy. 1 dose of Td every 10 years.  Varicella vaccine.** / Consult your health care provider. Pregnant females who do not have evidence of immunity should receive the first dose after pregnancy.  HPV vaccine. / 3 doses over 6 months, if 72 and younger. The vaccine is not recommended for use in pregnant females. However, pregnancy testing is not needed before receiving a dose.  Measles, mumps, rubella (MMR) vaccine.** / You need at least 1 dose of MMR if you were born in 1957 or later. You may also need a 2nd dose. For females of childbearing age, rubella immunity should be determined. If there is no evidence of immunity, females who are not pregnant should be vaccinated. If there is no evidence of immunity, females who are pregnant should delay immunization until after pregnancy.  Pneumococcal 13-valent conjugate (PCV13) vaccine.** / Consult your health care provider.  Pneumococcal polysaccharide (PPSV23) vaccine.** / 1 to 2 doses if you smoke cigarettes or if you have certain conditions.  Meningococcal vaccine.** /  1 dose if you are age 19 to 21 years and a first-year college  student living in a residence hall, or have one of several medical conditions, you need to get vaccinated against meningococcal disease. You may also need additional booster doses.  Hepatitis A vaccine.** / Consult your health care provider.  Hepatitis B vaccine.** / Consult your health care provider.  Haemophilus influenzae type b (Hib) vaccine.** / Consult your health care provider. Ages 40 to 64 years  Blood pressure check.** / Every 1 to 2 years.  Lipid and cholesterol check.** / Every 5 years beginning at age 20 years.  Lung cancer screening. / Every year if you are aged 55-80 years and have a 30-pack-year history of smoking and currently smoke or have quit within the past 15 years. Yearly screening is stopped once you have quit smoking for at least 15 years or develop a health problem that would prevent you from having lung cancer treatment.  Clinical breast exam.** / Every year after age 40 years.  BRCA-related cancer risk assessment.** / For women who have family members with a BRCA-related cancer (breast, ovarian, tubal, or peritoneal cancers).  Mammogram.** / Every year beginning at age 40 years and continuing for as long as you are in good health. Consult with your health care provider.  Pap test.** / Every 3 years starting at age 30 years through age 65 or 70 years with a history of 3 consecutive normal Pap tests.  HPV screening.** / Every 3 years from ages 30 years through ages 65 to 70 years with a history of 3 consecutive normal Pap tests.  Fecal occult blood test (FOBT) of stool. / Every year beginning at age 50 years and continuing until age 75 years. You may not need to do this test if you get a colonoscopy every 10 years.  Flexible sigmoidoscopy or colonoscopy.** / Every 5 years for a flexible sigmoidoscopy or every 10 years for a colonoscopy beginning at age 50 years and continuing until age 75 years.  Hepatitis C blood test.** / For all people born from 1945 through  1965 and any individual with known risks for hepatitis C.  Skin self-exam. / Monthly.  Influenza vaccine. / Every year.  Tetanus, diphtheria, and acellular pertussis (Tdap/Td) vaccine.** / Consult your health care provider. Pregnant women should receive 1 dose of Tdap vaccine during each pregnancy. 1 dose of Td every 10 years.  Varicella vaccine.** / Consult your health care provider. Pregnant females who do not have evidence of immunity should receive the first dose after pregnancy.  Zoster vaccine.** / 1 dose for adults aged 60 years or older.  Measles, mumps, rubella (MMR) vaccine.** / You need at least 1 dose of MMR if you were born in 1957 or later. You may also need a 2nd dose. For females of childbearing age, rubella immunity should be determined. If there is no evidence of immunity, females who are not pregnant should be vaccinated. If there is no evidence of immunity, females who are pregnant should delay immunization until after pregnancy.  Pneumococcal 13-valent conjugate (PCV13) vaccine.** / Consult your health care provider.  Pneumococcal polysaccharide (PPSV23) vaccine.** / 1 to 2 doses if you smoke cigarettes or if you have certain conditions.  Meningococcal vaccine.** / Consult your health care provider.  Hepatitis A vaccine.** / Consult your health care provider.  Hepatitis B vaccine.** / Consult your health care provider.  Haemophilus influenzae type b (Hib) vaccine.** / Consult your health care provider. Ages 65   years and over  Blood pressure check.** / Every 1 to 2 years.  Lipid and cholesterol check.** / Every 5 years beginning at age 22 years.  Lung cancer screening. / Every year if you are aged 73-80 years and have a 30-pack-year history of smoking and currently smoke or have quit within the past 15 years. Yearly screening is stopped once you have quit smoking for at least 15 years or develop a health problem that would prevent you from having lung cancer  treatment.  Clinical breast exam.** / Every year after age 4 years.  BRCA-related cancer risk assessment.** / For women who have family members with a BRCA-related cancer (breast, ovarian, tubal, or peritoneal cancers).  Mammogram.** / Every year beginning at age 40 years and continuing for as long as you are in good health. Consult with your health care provider.  Pap test.** / Every 3 years starting at age 9 years through age 34 or 91 years with 3 consecutive normal Pap tests. Testing can be stopped between 65 and 70 years with 3 consecutive normal Pap tests and no abnormal Pap or HPV tests in the past 10 years.  HPV screening.** / Every 3 years from ages 57 years through ages 64 or 45 years with a history of 3 consecutive normal Pap tests. Testing can be stopped between 65 and 70 years with 3 consecutive normal Pap tests and no abnormal Pap or HPV tests in the past 10 years.  Fecal occult blood test (FOBT) of stool. / Every year beginning at age 15 years and continuing until age 17 years. You may not need to do this test if you get a colonoscopy every 10 years.  Flexible sigmoidoscopy or colonoscopy.** / Every 5 years for a flexible sigmoidoscopy or every 10 years for a colonoscopy beginning at age 86 years and continuing until age 71 years.  Hepatitis C blood test.** / For all people born from [birth year] through 1964 and any individual with known risks for hepatitis C.  Osteoporosis screening.** / A one-time screening for women ages 83 years and over and women at risk for fractures or osteoporosis.  Skin self-exam. / Monthly.  Influenza vaccine. / Every year.  Tetanus, diphtheria, and acellular pertussis (Tdap/Td) vaccine.** / 1 dose of Td every 10 years.  Varicella vaccine.** / Consult your health care provider.  Zoster vaccine.** / 1 dose for adults aged 61 years or older.  Pneumococcal 13-valent conjugate (PCV13) vaccine.** / Consult your health care provider.  Pneumococcal  polysaccharide (PPSV23) vaccine.** / 1 dose for all adults aged 28 years and older.  Meningococcal vaccine.** / Consult your health care provider.  Hepatitis A vaccine.** / Consult your health care provider.  Hepatitis B vaccine.** / Consult your health care provider.  Haemophilus influenzae type b (Hib) vaccine.** / Consult your health care provider. ** Family history and personal history of risk and conditions may change your health care provider's recommendations. Document Released: 10/04/2001 Document Revised: 12/23/2013 Document Reviewed: 01/03/2011 [facility] Patient Information 2015 [place], [place]. This information is not intended to replace advice given to you by your health care provider. Make sure you discuss any questions you have with your health care provider.

## 2014-12-01 NOTE — Progress Notes (Signed)
Pre visit review using our clinic review tool, if applicable. No additional management support is needed unless otherwise documented below in the visit note. 
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
## 2014-12-02 ENCOUNTER — Telehealth: Payer: Self-pay | Admitting: Family Medicine

## 2014-12-02 ENCOUNTER — Other Ambulatory Visit: Payer: Self-pay | Admitting: Family Medicine

## 2014-12-02 DIAGNOSIS — R202 Paresthesia of skin: Secondary | ICD-10-CM

## 2014-12-02 DIAGNOSIS — M79643 Pain in unspecified hand: Secondary | ICD-10-CM

## 2014-12-02 DIAGNOSIS — M5030 Other cervical disc degeneration, unspecified cervical region: Secondary | ICD-10-CM

## 2014-12-02 LAB — HIV ANTIBODY (ROUTINE TESTING W REFLEX): HIV: NONREACTIVE

## 2014-12-02 NOTE — Telephone Encounter (Signed)
Patient informed of results.  

## 2014-12-02 NOTE — Telephone Encounter (Signed)
please call with test results

## 2014-12-02 NOTE — Telephone Encounter (Signed)
Pt returning your call regarding lab results.  

## 2014-12-03 ENCOUNTER — Encounter: Payer: Self-pay | Admitting: *Deleted

## 2014-12-04 LAB — URINE CULTURE: Colony Count: 15000

## 2014-12-07 ENCOUNTER — Encounter: Payer: Self-pay | Admitting: Family Medicine

## 2014-12-07 DIAGNOSIS — I10 Essential (primary) hypertension: Secondary | ICD-10-CM | POA: Insufficient documentation

## 2014-12-07 DIAGNOSIS — N760 Acute vaginitis: Secondary | ICD-10-CM | POA: Insufficient documentation

## 2014-12-07 DIAGNOSIS — R519 Headache, unspecified: Secondary | ICD-10-CM | POA: Insufficient documentation

## 2014-12-07 DIAGNOSIS — F418 Other specified anxiety disorders: Secondary | ICD-10-CM | POA: Insufficient documentation

## 2014-12-07 DIAGNOSIS — R202 Paresthesia of skin: Secondary | ICD-10-CM | POA: Insufficient documentation

## 2014-12-07 DIAGNOSIS — N3281 Overactive bladder: Secondary | ICD-10-CM | POA: Insufficient documentation

## 2014-12-07 DIAGNOSIS — R51 Headache: Secondary | ICD-10-CM

## 2014-12-07 DIAGNOSIS — N390 Urinary tract infection, site not specified: Secondary | ICD-10-CM | POA: Insufficient documentation

## 2014-12-07 DIAGNOSIS — F988 Other specified behavioral and emotional disorders with onset usually occurring in childhood and adolescence: Secondary | ICD-10-CM | POA: Insufficient documentation

## 2014-12-07 DIAGNOSIS — M542 Cervicalgia: Secondary | ICD-10-CM | POA: Insufficient documentation

## 2014-12-07 NOTE — Assessment & Plan Note (Signed)
Encouraged increased hydration, 64 ounces of clear fluids daily. Minimize alcohol and caffeine. Eat small frequent meals with lean proteins and complex carbs. Avoid high and low blood sugars. Get adequate sleep, 7-8 hours a night. Needs exercise daily preferably in the morning.  

## 2014-12-07 NOTE — Assessment & Plan Note (Signed)
Encouraged moist heat and gentle stretching as tolerated. May try NSAIDs and prescription meds as directed and report if symptoms worsen or seek immediate care. Xray confirms ddd and foraminal narrowing c/w paresthesias and pain, will refer to neurosurgery for further consideration.

## 2014-12-07 NOTE — Assessment & Plan Note (Signed)
Has been maintained on Adderall with good results will allow to continue

## 2014-12-07 NOTE — Assessment & Plan Note (Signed)
Well controlled, no changes to meds. Encouraged heart healthy diet such as the DASH diet and exercise as tolerated.  °

## 2014-12-07 NOTE — Assessment & Plan Note (Signed)
Has been using Citalopram but is still struggling with symptoms will increase Citalopram and she will notify us if any concerns.

## 2014-12-07 NOTE — Assessment & Plan Note (Signed)
Mild discharge, no new partners. Likely BV encouraged probiotics, increased hydration, cotton undergarments and wipe with vinegar, testing if does not improve.

## 2014-12-07 NOTE — Assessment & Plan Note (Signed)
Encouraged Kegel exercises bid and consider further treatment if no improvement

## 2014-12-07 NOTE — Assessment & Plan Note (Signed)
Recent infection, repeat culture negative

## 2014-12-15 ENCOUNTER — Telehealth: Payer: Self-pay | Admitting: Family Medicine

## 2014-12-15 NOTE — Telephone Encounter (Signed)
I am willing to start her on a Gabapentin 100 mg po qhs but would need an appt to increase dosing or stay on med. Disp #30, no rf

## 2014-12-15 NOTE — Telephone Encounter (Signed)
Relation to pt: SELF  Call back number: [phone] Pharmacy:  [id number] - [place], [place] - [address] [phone] (Phone) [phone] (Fax)         Reason for call:   [facility] they are referring pt to a Rheumatoid arthritis   specialist. [facility] advised pt to call PCP and request RX for the following symptoms tingle and numbness and pain in fingers in  both hands.

## 2014-12-16 MED ORDER — GABAPENTIN 100 MG PO CAPS: 100.0000 mg | ORAL_CAPSULE | Freq: Every day | ORAL | Status: DC

## 2014-12-16 NOTE — Telephone Encounter (Signed)
Patient has been informed of PCP instructions regarding appointment/gabapentin sent to [facility] [place].  Patient verbalized understanding and agree to do.

## 2014-12-16 NOTE — Telephone Encounter (Signed)
Called left message to call back 

## 2014-12-16 NOTE — Telephone Encounter (Signed)
Called left msg. To call back 

## 2014-12-16 NOTE — Telephone Encounter (Signed)
[facility] as instructed by PCP. Called the patient left msg. To call back

## 2015-01-14 ENCOUNTER — Other Ambulatory Visit: Payer: Self-pay | Admitting: Orthopedic Surgery

## 2015-02-02 ENCOUNTER — Ambulatory Visit (INDEPENDENT_AMBULATORY_CARE_PROVIDER_SITE_OTHER): Payer: Managed Care, Other (non HMO) | Admitting: Physician Assistant

## 2015-02-02 ENCOUNTER — Encounter: Payer: Self-pay | Admitting: Physician Assistant

## 2015-02-02 VITALS — BP 121/68 | HR 57 | Temp 98.1°F | Resp 16 | Ht 62.5 in | Wt 142.2 lb

## 2015-02-02 DIAGNOSIS — R399 Unspecified symptoms and signs involving the genitourinary system: Secondary | ICD-10-CM

## 2015-02-02 LAB — POCT URINALYSIS DIPSTICK
Bilirubin, UA: NEGATIVE
Blood, UA: POSITIVE
Glucose, UA: NEGATIVE
Ketones, UA: NEGATIVE
Nitrite, UA: NEGATIVE
Protein, UA: NEGATIVE
Spec Grav, UA: 1.025
Urobilinogen, UA: 0.2
pH, UA: 5.5

## 2015-02-02 MED ORDER — CIPROFLOXACIN HCL 500 MG PO TABS
500.0000 mg | ORAL_TABLET | Freq: Two times a day (BID) | ORAL | Status: DC
Start: 2015-02-02 — End: 2015-03-18

## 2015-02-02 NOTE — Progress Notes (Signed)
History of Present Illness: Suzanne Barber is a 59 y.o. female who present to the clinic today complaining of urinary frequency, urinary urgency and suprapubic pressure.  Patient endorses mild low back pain.  Patient denies fever, chills, nausea/vomiting and flank pain. Has + history of UTI.  Also with history of urine incontinence taking Ditropan as directed with good results.   History: Past Medical History  Diagnosis Date  . ADD (attention deficit disorder)   . Frequent headaches   . Urine incontinence   . Depression   . Hypertension   . Depression with anxiety     Current outpatient prescriptions:  .  amphetamine-dextroamphetamine (ADDERALL) 5 MG tablet, Take 1 tablet (5 mg total) by mouth 2 (two) times daily with a meal. June 2016, Disp: 60 tablet, Rfl: 0 .  aspirin 81 MG EC tablet, Take 81 mg by mouth daily., Disp: , Rfl:  .  Cholecalciferol (VITAMIN D) 2000 UNITS CAPS, Take by mouth., Disp: , Rfl:  .  citalopram (CELEXA) 40 MG tablet, Take 1 tablet (40 mg total) by mouth daily., Disp: 30 tablet, Rfl: 3 .  cyclobenzaprine (FLEXERIL) 10 MG tablet, Take 1 tablet (10 mg total) by mouth 2 (two) times daily as needed for muscle spasms., Disp: 40 tablet, Rfl: 1 .  gabapentin (NEURONTIN) 100 MG capsule, Take 1 capsule (100 mg total) by mouth at bedtime., Disp: 30 capsule, Rfl: 0 .  hydrochlorothiazide (MICROZIDE) 12.5 MG capsule, Take 1 capsule (12.5 mg total) by mouth as needed., Disp: 90 capsule, Rfl: 1 .  ibuprofen (ADVIL,MOTRIN) 600 MG tablet, Take 1 tablet (600 mg total) by mouth every 6 (six) hours as needed., Disp: 30 tablet, Rfl: 0 .  Multiple Vitamin (MULTIVITAMIN) tablet, Take 1 tablet by mouth daily., Disp: , Rfl:  .  Omega-3 Fatty Acids (OMEGA 3 PO), Take by mouth 2 (two) times daily., Disp: , Rfl:  .  oxybutynin (DITROPAN) 5 MG tablet, Take 1 tablet (5 mg total) by mouth daily as needed., Disp: 90 tablet, Rfl: 1 .  ciprofloxacin (CIPRO) 500 MG tablet, Take 1 tablet (500  mg total) by mouth 2 (two) times daily., Disp: 6 tablet, Rfl: 0 Allergies  Allergen Reactions  . Shellfish Allergy Rash  . Penicillins Itching and Swelling  . Latex Rash   Family History  Problem Relation Age of Onset  . Diabetes Mother   . Colon cancer Mother   . Stroke Mother   . Hypertension Mother   . Colon cancer Sister   . Cancer Sister 43  . Diabetes Sister   . Breast cancer Sister   . Cancer Sister     breast  . Breast cancer Maternal Grandmother   . Alcohol abuse Father     cirrhosis  . Diverticulitis Father   . Cancer Sister     cervix  . Heart disease Brother 28  . Mental illness Brother     schizophrenia   History   Social History  . Marital Status: Married    Spouse Name: N/A  . Number of Children: N/A  . Years of Education: N/A   Social History Main Topics  . Smoking status: Former Smoker    Quit date: 05/14/1983  . Smokeless tobacco: Not on file  . Alcohol Use: Yes  . Drug Use: No  . Sexual Activity: Yes     Comment: married, husband in [place], no major dietary restrictions. eats hi protein minimizes sugar   Other Topics Concern  . None  Social History Narrative   Review of Systems: See HPI.  All other ROS are negative.  Physical Examination: BP 121/68 mmHg  Pulse 57  Temp(Src) 98.1 F (36.7 C) (Oral)  Resp 16  Ht 5' 2.5" (1.588 m)  Wt 142 lb 4 oz (64.524 kg)  BMI 25.59 kg/m2  SpO2 100%  General appearance: alert, cooperative, appears stated age and no distress Head: Normocephalic, without obvious abnormality, atraumatic Lungs: clear to auscultation bilaterally Heart: regular rate and rhythm, S1, S2 normal, no murmur, click, rub or gallop Abdomen: soft, non-tender; bowel sounds normal; no masses,  no organomegaly and no CVA tenderness.  Labs/Diagnostics: Urinalysis    Component Value Date/Time   COLORURINE YELLOW 12/01/2014 1259   APPEARANCEUR CLEAR 12/01/2014 1259   LABSPEC 1.020 12/01/2014 1259   PHURINE 6.5 12/01/2014  1259   GLUCOSEU NEGATIVE 12/01/2014 1259   HGBUR NEGATIVE 12/01/2014 1259   BILIRUBINUR NEGATIVE 12/01/2014 1259   KETONESUR NEGATIVE 12/01/2014 1259   UROBILINOGEN 0.2 12/01/2014 1259   NITRITE NEGATIVE 12/01/2014 1259   LEUKOCYTESUR moderate (2+)* 02/02/2015 1610    Assessment/Plan: UTI symptoms Urine dip with blood and LE. Classic Symptoms.  Will start Cipro BID x 3 days while culture is sent.  Supportive measures reviewed.  Follow-up PRN.

## 2015-02-02 NOTE — Progress Notes (Signed)
Pre visit review using our clinic review tool, if applicable. No additional management support is needed unless otherwise documented below in the visit note/SLS  

## 2015-02-02 NOTE — Patient Instructions (Signed)
Your symptoms are consistent with a bladder infection, also called acute cystitis. Please take your antibiotic (Cipro) as directed until all pills are gone.  Stay very well hydrated.  Consider a daily probiotic (Align, Culturelle, or Activia) to help prevent stomach upset caused by the antibiotic.  Taking a probiotic daily may also help prevent recurrent UTIs.  Also consider taking AZO (Phenazopyridine) tablets to help decrease pain with urination.  I will call you with your urine testing results.  We will change antibiotics if indicated.  Call or return to clinic if symptoms are not resolved by completion of antibiotic.   Urinary Tract Infection A urinary tract infection (UTI) can occur any place along the urinary tract. The tract includes the kidneys, ureters, bladder, and urethra. A type of germ called bacteria often causes a UTI. UTIs are often helped with antibiotic medicine.  HOME CARE   If given, take antibiotics as told by your doctor. Finish them even if you start to feel better.  Drink enough fluids to keep your pee (urine) clear or pale yellow.  Avoid tea, drinks with caffeine, and bubbly (carbonated) drinks.  Pee often. Avoid holding your pee in for a long time.  Pee before and after having sex (intercourse).  Wipe from front to back after you poop (bowel movement) if you are a woman. Use each tissue only once. GET HELP RIGHT AWAY IF:   You have back pain.  You have lower belly (abdominal) pain.  You have chills.  You feel sick to your stomach (nauseous).  You throw up (vomit).  Your burning or discomfort with peeing does not go away.  You have a fever.  Your symptoms are not better in 3 days. MAKE SURE YOU:   Understand these instructions.  Will watch your condition.  Will get help right away if you are not doing well or get worse. Document Released: 01/25/2008 Document Revised: 05/02/2012 Document Reviewed: 03/08/2012 [employer] Patient Information 2015  [employer]. This information is not intended to replace advice given to you by your health care provider. Make sure you discuss any questions you have with your health care provider.    

## 2015-02-02 NOTE — Assessment & Plan Note (Signed)
Urine dip with blood and LE. Classic Symptoms.  Will start Cipro BID x 3 days while culture is sent.  Supportive measures reviewed.  Follow-up PRN.

## 2015-02-04 LAB — CULTURE, URINE COMPREHENSIVE
Colony Count: NO GROWTH
Organism ID, Bacteria: NO GROWTH

## 2015-02-27 ENCOUNTER — Telehealth: Payer: Self-pay | Admitting: *Deleted

## 2015-02-27 NOTE — Telephone Encounter (Signed)
Pt signed ROI received via fax from [employer]. Forwarded to [place] to scan/email to medical records. JG//CMA

## 2015-03-18 ENCOUNTER — Ambulatory Visit (INDEPENDENT_AMBULATORY_CARE_PROVIDER_SITE_OTHER): Payer: Managed Care, Other (non HMO) | Admitting: Physician Assistant

## 2015-03-18 ENCOUNTER — Encounter: Payer: Self-pay | Admitting: Physician Assistant

## 2015-03-18 VITALS — BP 140/84 | HR 58 | Temp 97.8°F | Ht 62.5 in | Wt 139.0 lb

## 2015-03-18 DIAGNOSIS — L0390 Cellulitis, unspecified: Secondary | ICD-10-CM

## 2015-03-18 DIAGNOSIS — L301 Dyshidrosis [pompholyx]: Secondary | ICD-10-CM

## 2015-03-18 DIAGNOSIS — L0291 Cutaneous abscess, unspecified: Secondary | ICD-10-CM | POA: Diagnosis not present

## 2015-03-18 MED ORDER — CLOBETASOL PROPIONATE 0.05 % EX OINT: 1.0000 "application " | TOPICAL_OINTMENT | Freq: Two times a day (BID) | CUTANEOUS | Status: DC

## 2015-03-18 MED ORDER — DOXYCYCLINE HYCLATE 100 MG PO CAPS: 100.0000 mg | ORAL_CAPSULE | Freq: Two times a day (BID) | ORAL | Status: DC

## 2015-03-18 NOTE — Assessment & Plan Note (Signed)
Rx Temovate ointment to apply twice daily during flares. Supportive measures discussed. If not improving, will refer to dermatology.

## 2015-03-18 NOTE — Assessment & Plan Note (Addendum)
No fluctuance noted on today's exam for incision and drainage. Rx doxycycline twice a day 10 days. Continue warm compresses. Supportive measures reviewed. Follow-up if symptoms are not improving within antibiotic, or if  anything worsens

## 2015-03-18 NOTE — Patient Instructions (Signed)
Please take the antibiotic twice daily as directed for the infection. Continue warm compresses. Symptoms should resolve with completion of antibiotic.  For dishydrotic eczema, please use ointment twice daily as directed. Avoid excessive hand washing. If symptoms are not resolving, we can consider starting oral steroid or Dermatology referral.

## 2015-03-18 NOTE — Progress Notes (Signed)
Patient presents to clinic today c/o swollen, red and tender lump of right axillary region noticed Sunday. Patient denies fever or chills. Does endorse recently shaving her armpits. She has been applying warm compresses to the area. Denies drainage.  Patient also complains of rash of bilateral hands that has been present for over a year. States rash flares and then goes away. States it looks like the skin is bubbly.  Past Medical History  Diagnosis Date  . ADD (attention deficit disorder)   . Frequent headaches   . Urine incontinence   . Depression   . Hypertension   . Depression with anxiety     Current Outpatient Prescriptions on File Prior to Visit  Medication Sig Dispense Refill  . amphetamine-dextroamphetamine (ADDERALL) 5 MG tablet Take 1 tablet (5 mg total) by mouth 2 (two) times daily with a meal. June 2016 60 tablet 0  . aspirin 81 MG EC tablet Take 81 mg by mouth daily.    . Cholecalciferol (VITAMIN D) 2000 UNITS CAPS Take by mouth.    . citalopram (CELEXA) 40 MG tablet Take 1 tablet (40 mg total) by mouth daily. 30 tablet 3  . cyclobenzaprine (FLEXERIL) 10 MG tablet Take 1 tablet (10 mg total) by mouth 2 (two) times daily as needed for muscle spasms. 40 tablet 1  . hydrochlorothiazide (MICROZIDE) 12.5 MG capsule Take 1 capsule (12.5 mg total) by mouth as needed. 90 capsule 1  . ibuprofen (ADVIL,MOTRIN) 600 MG tablet Take 1 tablet (600 mg total) by mouth every 6 (six) hours as needed. 30 tablet 0  . Multiple Vitamin (MULTIVITAMIN) tablet Take 1 tablet by mouth daily.    . Omega-3 Fatty Acids (OMEGA 3 PO) Take by mouth 2 (two) times daily.    Marland Kitchen oxybutynin (DITROPAN) 5 MG tablet Take 1 tablet (5 mg total) by mouth daily as needed. 90 tablet 1  . gabapentin (NEURONTIN) 100 MG capsule Take 1 capsule (100 mg total) by mouth at bedtime. (Patient not taking: Reported on 03/18/2015) 30 capsule 0   No current facility-administered medications on file prior to visit.    Allergies    Allergen Reactions  . Shellfish Allergy Rash  . Penicillins Itching and Swelling  . Latex Rash    Family History  Problem Relation Age of Onset  . Diabetes Mother   . Colon cancer Mother   . Stroke Mother   . Hypertension Mother   . Colon cancer Sister   . Cancer Sister 92  . Diabetes Sister   . Breast cancer Sister   . Cancer Sister     breast  . Breast cancer Maternal Grandmother   . Alcohol abuse Father     cirrhosis  . Diverticulitis Father   . Cancer Sister     cervix  . Heart disease Brother 71  . Mental illness Brother     schizophrenia    History   Social History  . Marital Status: Married    Spouse Name: N/A  . Number of Children: N/A  . Years of Education: N/A   Social History Main Topics  . Smoking status: Former Smoker    Quit date: 05/14/1983  . Smokeless tobacco: Not on file  . Alcohol Use: Yes  . Drug Use: No  . Sexual Activity: Yes     Comment: married, husband in [place], no major dietary restrictions. eats hi protein minimizes sugar   Other Topics Concern  . Not on file   Social History Narrative  Review of Systems - See HPI.  All other ROS are negative.  BP 140/84 mmHg  Pulse 58  Temp(Src) 97.8 F (36.6 C) (Oral)  Ht 5' 2.5" (1.588 m)  Wt 139 lb (63.05 kg)  BMI 25.00 kg/m2  SpO2 99%  Physical Exam  Constitutional: She is oriented to person, place, and time and well-developed, well-nourished, and in no distress.  HENT:  Head: Normocephalic and atraumatic.  Cardiovascular: Normal rate, regular rhythm, normal heart sounds and intact distal pulses.   Pulmonary/Chest: Effort normal and breath sounds normal. No respiratory distress. She has no wheezes. She has no rales. She exhibits no tenderness.  Neurological: She is alert and oriented to person, place, and time.  Skin: Skin is warm and dry.     Vitals reviewed.   Recent Results (from the past 2160 hour(s))  POCT urinalysis dipstick     Status: Abnormal   Collection  Time: 02/02/15  4:10 PM  Result Value Ref Range   Color, UA yellow    Clarity, UA clear    Glucose, UA negative    Bilirubin, UA neg    Ketones, UA neg    Spec Grav, UA 1.025    Blood, UA positive    pH, UA 5.5    Protein, UA neg    Urobilinogen, UA 0.2    Nitrite, UA neg    Leukocytes, UA moderate (2+) (A) Negative  CULTURE, URINE COMPREHENSIVE     Status: None   Collection Time: 02/02/15  5:21 PM  Result Value Ref Range   Colony Count NO GROWTH    Organism ID, Bacteria NO GROWTH     Assessment/Plan: Dyshidrotic eczema Rx Temovate ointment to apply twice daily during flares. Supportive measures discussed. If not improving, will refer to dermatology.  Cellulitis and abscess No fluctuance noted on today's exam for incision and drainage. Rx doxycycline twice a day 10 days. Continue warm compresses. Supportive measures reviewed. Follow-up if symptoms are not improving within antibiotic, or if  anything worsens

## 2015-03-18 NOTE — Progress Notes (Signed)
Pre visit review using our clinic review tool, if applicable. No additional management support is needed unless otherwise documented below in the visit note. 

## 2015-04-05 ENCOUNTER — Other Ambulatory Visit: Payer: Self-pay | Admitting: Family Medicine

## 2015-04-17 ENCOUNTER — Telehealth: Payer: Self-pay | Admitting: Family Medicine

## 2015-04-17 NOTE — Telephone Encounter (Signed)
Please refer to 02/27/15 phone note.

## 2015-04-17 NOTE — Telephone Encounter (Signed)
Called [facility] and informed her that documents were received and sent to Medical Records. Gave her medical records phone num. that way they can give the status of requested documents.

## 2015-04-17 NOTE — Telephone Encounter (Signed)
Caller name: Lalita Relation to pt: [employer] back number: [phone] Pharmacy:  Reason for call: Asking for the status of documents sent to office on 02-25-15 wants to know if documents were received. Please advise.

## 2015-04-20 ENCOUNTER — Telehealth: Payer: Self-pay | Admitting: Physician Assistant

## 2015-04-20 ENCOUNTER — Ambulatory Visit: Payer: Managed Care, Other (non HMO) | Admitting: Physician Assistant

## 2015-04-21 ENCOUNTER — Encounter: Payer: Self-pay | Admitting: Medical

## 2015-04-21 ENCOUNTER — Ambulatory Visit (INDEPENDENT_AMBULATORY_CARE_PROVIDER_SITE_OTHER): Payer: Managed Care, Other (non HMO) | Admitting: Medical

## 2015-04-21 VITALS — BP 114/80 | HR 77 | Temp 98.0°F | Ht 62.5 in | Wt 143.6 lb

## 2015-04-21 DIAGNOSIS — L301 Dyshidrosis [pompholyx]: Secondary | ICD-10-CM

## 2015-04-21 DIAGNOSIS — R1010 Upper abdominal pain, unspecified: Secondary | ICD-10-CM | POA: Diagnosis not present

## 2015-04-21 MED ORDER — RANITIDINE HCL 150 MG PO CAPS: 150.0000 mg | ORAL_CAPSULE | Freq: Two times a day (BID) | ORAL | Status: DC

## 2015-04-21 MED ORDER — HYDROXYZINE HCL 25 MG PO TABS: 25.0000 mg | ORAL_TABLET | Freq: Three times a day (TID) | ORAL | Status: DC | PRN

## 2015-04-21 MED ORDER — CLOBETASOL PROPIONATE 0.05 % EX OINT: 1.0000 "application " | TOPICAL_OINTMENT | Freq: Two times a day (BID) | CUTANEOUS | Status: DC

## 2015-04-21 NOTE — Progress Notes (Signed)
Subjective:    Patient ID: Suzanne Barber, female    DOB: 01-Feb-1956, 59 y.o.   MRN: [id number]  HPI  Pt in with rash on both palms and it itches as well. Pt was evaluated by Selena Batten in past. Pt was on temovate ointment written by Malva Cogan PA-C. His plan was if she was not better would refer to dermatologist. He dx her with dishidrotic eczema. Pt states she has been struggling with this for at least one year. Hands itch a lot.  Pt states she has carpel tunnel pain. She states she takes hydrocodone for pain in her arms. Pt has upcoming surgery.   Pt states she has pain in her stomach that occurs with medication hydrocodone/tylenol She does not associate abdomen pain with foods. Pain typically after taking the tabs.      Review of Systems  Constitutional: Negative for fever, chills and fatigue.  Respiratory: Negative for cough, chest tightness, shortness of breath and wheezing.   Cardiovascular: Negative for chest pain and palpitations.  Gastrointestinal: Positive for abdominal pain.       She associated with hydrocodone/tylenol  Musculoskeletal: Negative for back pain.       CTS discomfort and some hand pain.  Skin: Positive for rash.  Hematological: Negative for adenopathy. Does not bruise/bleed easily.    Past Medical History  Diagnosis Date  . ADD (attention deficit disorder)   . Frequent headaches   . Urine incontinence   . Depression   . Hypertension   . Depression with anxiety     Social History   Social History  . Marital Status: Married    Spouse Name: N/A  . Number of Children: N/A  . Years of Education: N/A   Occupational History  . Not on file.   Social History Main Topics  . Smoking status: Former Smoker    Quit date: 05/14/1983  . Smokeless tobacco: Not on file  . Alcohol Use: Yes  . Drug Use: No  . Sexual Activity: Yes     Comment: married, husband in [place], no major dietary restrictions. eats hi protein minimizes sugar   Other Topics  Concern  . Not on file   Social History Narrative    Past Surgical History  Procedure Laterality Date  . Breast biopsy  2006    Family History  Problem Relation Age of Onset  . Diabetes Mother   . Colon cancer Mother   . Stroke Mother   . Hypertension Mother   . Colon cancer Sister   . Cancer Sister 75  . Diabetes Sister   . Breast cancer Sister   . Cancer Sister     breast  . Breast cancer Maternal Grandmother   . Alcohol abuse Father     cirrhosis  . Diverticulitis Father   . Cancer Sister     cervix  . Heart disease Brother 83  . Mental illness Brother     schizophrenia    Allergies  Allergen Reactions  . Shellfish Allergy Rash  . Penicillins Itching and Swelling  . Latex Rash    Current Outpatient Prescriptions on File Prior to Visit  Medication Sig Dispense Refill  . amphetamine-dextroamphetamine (ADDERALL) 5 MG tablet Take 1 tablet (5 mg total) by mouth 2 (two) times daily with a meal. June 2016 60 tablet 0  . aspirin 81 MG EC tablet Take 81 mg by mouth daily.    . cetirizine (ZYRTEC) 10 MG tablet Take 10 mg by mouth daily.    Marland Kitchen  Cholecalciferol (VITAMIN D) 2000 UNITS CAPS Take by mouth.    . citalopram (CELEXA) 40 MG tablet TAKE 1 TABLET BY MOUTH EVERY DAY 30 tablet 3  . clobetasol ointment (TEMOVATE) 0.05 % Apply 1 application topically 2 (two) times daily. 30 g 0  . cyclobenzaprine (FLEXERIL) 10 MG tablet Take 1 tablet (10 mg total) by mouth 2 (two) times daily as needed for muscle spasms. 40 tablet 1  . doxycycline (VIBRAMYCIN) 100 MG capsule Take 1 capsule (100 mg total) by mouth 2 (two) times daily. 20 capsule 0  . gabapentin (NEURONTIN) 100 MG capsule Take 1 capsule (100 mg total) by mouth at bedtime. 30 capsule 0  . hydrochlorothiazide (MICROZIDE) 12.5 MG capsule Take 1 capsule (12.5 mg total) by mouth as needed. 90 capsule 1  . HYDROcodone-acetaminophen (NORCO/VICODIN) 5-325 MG per tablet Take by mouth. TAKE 1-2 TABLETS EVERY 4-6 HOURS AS NEEDED FOR  PAIN.  0  . ibuprofen (ADVIL,MOTRIN) 600 MG tablet Take 1 tablet (600 mg total) by mouth every 6 (six) hours as needed. 30 tablet 0  . Multiple Vitamin (MULTIVITAMIN) tablet Take 1 tablet by mouth daily.    . Omega-3 Fatty Acids (OMEGA 3 PO) Take by mouth 2 (two) times daily.    Marland Kitchen oxybutynin (DITROPAN) 5 MG tablet Take 1 tablet (5 mg total) by mouth daily as needed. 90 tablet 1   No current facility-administered medications on file prior to visit.    BP 114/80 mmHg  Pulse 77  Temp(Src) 98 F (36.7 C) (Oral)  Ht 5' 2.5" (1.588 m)  Wt 143 lb 9.6 oz (65.137 kg)  BMI 25.83 kg/m2  SpO2 98%       Objective:   Physical Exam  General Appearance- Not in acute distress.  HEENT Eyes- Scleraeral/Conjuntiva-bilat- Not Yellow. Mouth & Throat- Normal.  Chest and Lung Exam Auscultation: Breath sounds:-Normal. CTA. Adventitious sounds:- No Adventitious sounds.  Cardiovascular Auscultation:Rythm - Regular. Heart Sounds -Normal heart sounds.  Abdomen Inspection:-Inspection Normal.  Palpation/Perucssion: Palpation and Percussion of the abdomen reveal- Non Tender, No Rebound tenderness, No rigidity(Guarding) and No Palpable abdominal masses.  Liver:-Normal.  Spleen:- Normal.   Skin- on pt hands show  scattered hyperpigmented rash all over her palmar aspect. Very dry skin.No ulcers. No lesions.  Neuro- CN III-XII intact     Assessment & Plan:  Will refill your steroid topical ointment. Moisturize hands and rx hydroxyzine to help with itch. But will refer you to dermatologist.  For your abdomen pain. Will rx ranitidine. And get cbc, cmp and pancrease studies. If abdomen pain worsens or changes let us know.  Follow up in 2-3 wks with pcp or as needed.

## 2015-04-21 NOTE — Progress Notes (Signed)
Pre visit review using our clinic review tool, if applicable. No additional management support is needed unless otherwise documented below in the visit note. 

## 2015-04-21 NOTE — Patient Instructions (Addendum)
Will refill your steroid topical ointment. Moisturize hands and rx hydroxyzine to help with itch. But will refer you to dermatologist.  For your abdomen pain. Will rx ranitidine. And get cbc, cmp and pancrease studies. If abdomen pain worsens or changes let us know.  Follow up in 2-3 wks with pcp or as needed.

## 2015-04-23 ENCOUNTER — Encounter: Payer: Self-pay | Admitting: Family Medicine

## 2015-04-23 NOTE — Telephone Encounter (Signed)
No charge. 

## 2015-04-23 NOTE — Telephone Encounter (Signed)
Pt was no show 04/20/15 3:00pm, acute appt, pt came in late for appt and rescheduled for 04/21/15 with Ramon Dredge, charge for no show?

## 2015-04-28 NOTE — Telephone Encounter (Signed)
Labs put in for abdominal pain. Looks like not done. How is she now. Is pain resolved.

## 2015-04-29 NOTE — Telephone Encounter (Signed)
Left message for patient to return my call.

## 2015-04-30 MED ORDER — TRAMADOL HCL 50 MG PO TABS: 50.0000 mg | ORAL_TABLET | Freq: Four times a day (QID) | ORAL | Status: DC | PRN

## 2015-04-30 NOTE — Telephone Encounter (Signed)
Spoke with pt and she voices understanding. Pt is feeling some better. Per ES-PA Filled tramadol  every 6hr prn, until pt can see surgeon. Pt will call back if pain worsens.

## 2015-04-30 NOTE — Addendum Note (Signed)
Addended by: Neldon Labella on: 04/30/2015 02:39 PM   Modules accepted: Orders

## 2015-05-07 ENCOUNTER — Ambulatory Visit (INDEPENDENT_AMBULATORY_CARE_PROVIDER_SITE_OTHER): Payer: Managed Care, Other (non HMO) | Admitting: Family Medicine

## 2015-05-07 VITALS — BP 100/52 | HR 62 | Temp 98.9°F | Ht 62.5 in | Wt 145.8 lb

## 2015-05-07 DIAGNOSIS — L309 Dermatitis, unspecified: Secondary | ICD-10-CM

## 2015-05-07 DIAGNOSIS — F988 Other specified behavioral and emotional disorders with onset usually occurring in childhood and adolescence: Secondary | ICD-10-CM

## 2015-05-07 DIAGNOSIS — M545 Low back pain: Secondary | ICD-10-CM | POA: Diagnosis not present

## 2015-05-07 DIAGNOSIS — M542 Cervicalgia: Secondary | ICD-10-CM

## 2015-05-07 DIAGNOSIS — R202 Paresthesia of skin: Secondary | ICD-10-CM | POA: Diagnosis not present

## 2015-05-07 DIAGNOSIS — L301 Dyshidrosis [pompholyx]: Secondary | ICD-10-CM

## 2015-05-07 DIAGNOSIS — I10 Essential (primary) hypertension: Secondary | ICD-10-CM

## 2015-05-07 DIAGNOSIS — F909 Attention-deficit hyperactivity disorder, unspecified type: Secondary | ICD-10-CM

## 2015-05-07 MED ORDER — AMPHETAMINE-DEXTROAMPHETAMINE 5 MG PO TABS: 5.0000 mg | ORAL_TABLET | Freq: Two times a day (BID) | ORAL | Status: DC

## 2015-05-07 MED ORDER — PREDNISONE 20 MG PO TABS: ORAL_TABLET | ORAL | Status: DC

## 2015-05-07 NOTE — Patient Instructions (Signed)
Salon Pas gel as needed during day and patch on CT at bedtime after 15 minutes of ice, then place a splint on wrists night  Eczema Eczema, also called atopic dermatitis, is a skin disorder that causes inflammation of the skin. It causes a red rash and dry, scaly skin. The skin becomes very itchy. Eczema is generally worse during the cooler winter months and often improves with the warmth of summer. Eczema usually starts showing signs in infancy. Some children outgrow eczema, but it may last through adulthood.  CAUSES  The exact cause of eczema is not known, but it appears to run in families. People with eczema often have a family history of eczema, allergies, asthma, or hay fever. Eczema is not contagious. Flare-ups of the condition may be caused by:   Contact with something you are sensitive or allergic to.   Stress. SIGNS AND SYMPTOMS  Dry, scaly skin.   Red, itchy rash.   Itchiness. This may occur before the skin rash and may be very intense.  DIAGNOSIS  The diagnosis of eczema is usually made based on symptoms and medical history. TREATMENT  Eczema cannot be cured, but symptoms usually can be controlled with treatment and other strategies. A treatment plan might include:  Controlling the itching and scratching.   Use over-the-counter antihistamines as directed for itching. This is especially useful at night when the itching tends to be worse.   Use over-the-counter steroid creams as directed for itching.   Avoid scratching. Scratching makes the rash and itching worse. It may also result in a skin infection (impetigo) due to a break in the skin caused by scratching.   Keeping the skin well moisturized with creams every day. This will seal in moisture and help prevent dryness. Lotions that contain alcohol and water should be avoided because they can dry the skin.   Limiting exposure to things that you are sensitive or allergic to (allergens).   Recognizing situations  that cause stress.   Developing a plan to manage stress.  HOME CARE INSTRUCTIONS   Only take over-the-counter or prescription medicines as directed by your health care provider.   Do not use anything on the skin without checking with your health care provider.   Keep baths or showers short (5 minutes) in warm (not hot) water. Use mild cleansers for bathing. These should be unscented. You may add nonperfumed bath oil to the bath water. It is best to avoid soap and bubble bath.   Immediately after a bath or shower, when the skin is still damp, apply a moisturizing ointment to the entire body. This ointment should be a petroleum ointment. This will seal in moisture and help prevent dryness. The thicker the ointment, the better. These should be unscented.   Keep fingernails cut short. Children with eczema may need to wear soft gloves or mittens at night after applying an ointment.   Dress in clothes made of cotton or cotton blends. Dress lightly, because heat increases itching.   A child with eczema should stay away from anyone with fever blisters or cold sores. The virus that causes fever blisters (herpes simplex) can cause a serious skin infection in children with eczema. SEEK MEDICAL CARE IF:   Your itching interferes with sleep.   Your rash gets worse or is not better within 1 week after starting treatment.   You see pus or soft yellow scabs in the rash area.   You have a fever.   You have a rash flare-up  after contact with someone who has fever blisters.  Document Released: 08/05/2000 Document Revised: 05/29/2013 Document Reviewed: 03/11/2013 [facility] Patient Information 2015 [place], [place]. This information is not intended to replace advice given to you by your health care provider. Make sure you discuss any questions you have with your health care provider.

## 2015-05-07 NOTE — Progress Notes (Signed)
Pre visit review using our clinic review tool, if applicable. No additional management support is needed unless otherwise documented below in the visit note. 

## 2015-05-15 ENCOUNTER — Other Ambulatory Visit: Payer: Self-pay | Admitting: Family Medicine

## 2015-05-15 DIAGNOSIS — M542 Cervicalgia: Secondary | ICD-10-CM

## 2015-05-17 NOTE — Assessment & Plan Note (Signed)
Xray previously confirmed DDD disease pain is worsening with radicular symptoms proceed with MRI encouraged topical treatments prn

## 2015-05-17 NOTE — Assessment & Plan Note (Signed)
Well controlled, no changes to meds. Encouraged heart healthy diet such as the DASH diet and exercise as tolerated.  °

## 2015-05-17 NOTE — Assessment & Plan Note (Signed)
Throughout arms intermittently likely multifactorial involving neck and carpal tunnel. Encouraged icing topical treatments and braces for wrists and proceed with imaging

## 2015-05-17 NOTE — Assessment & Plan Note (Signed)
Using topical steroids with some relief

## 2015-05-17 NOTE — Progress Notes (Signed)
Subjective:    Patient ID: Suzanne Barber, female    DOB: 10/03/1955, 59 y.o.   MRN: [id number]  Chief Complaint  Patient presents with  . Hand Pain    with Rash  . Pain    Lower extremities    HPI Patient is in today for evaluation of pain. 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 Also has persistent and long-standing neck pain and headaches but is having worsening radicular symptoms down her arms. She is complaining of numbness tingling in her hands and wondering if she has carpal tunnel. She's had complete numbness in her right fifth toe recently as well. Continues to struggle intermittently with rash but notes the steroid-dependent helping her eczema somewhat. Denies CP/palp/SOB/HA/congestion/fevers/GI or GU c/o. Taking meds as prescribed  Past Medical History  Diagnosis Date  . ADD (attention deficit disorder)   . Frequent headaches   . Urine incontinence   . Depression   . Hypertension   . Depression with anxiety     Past Surgical History  Procedure Laterality Date  . Breast biopsy  2006    Family History  Problem Relation Age of Onset  . Diabetes Mother   . Colon cancer Mother   . Stroke Mother   . Hypertension Mother   . Colon cancer Sister   . Cancer Sister 69  . Diabetes Sister   . Breast cancer Sister   . Cancer Sister     breast  . Breast cancer Maternal Grandmother   . Alcohol abuse Father     cirrhosis  . Diverticulitis Father   . Cancer Sister     cervix  . Heart disease Brother 89  . Mental illness Brother     schizophrenia    Social History   Social History  . Marital Status: Married    Spouse Name: N/A  . Number of Children: N/A  . Years of Education: N/A   Occupational History  . Not on file.   Social History Main Topics  . Smoking status: Former Smoker    Quit date: 05/14/1983  . Smokeless tobacco: Not on file  . Alcohol Use: Yes  . Drug Use: No  . Sexual Activity: Yes     Comment: married,  husband in [place], no major dietary restrictions. eats hi protein minimizes sugar   Other Topics Concern  . Not on file   Social History Narrative    Outpatient Prescriptions Prior to Visit  Medication Sig Dispense Refill  . aspirin 81 MG EC tablet Take 81 mg by mouth daily.    . cetirizine (ZYRTEC) 10 MG tablet Take 10 mg by mouth daily.    . Cholecalciferol (VITAMIN D) 2000 UNITS CAPS Take by mouth.    . citalopram (CELEXA) 40 MG tablet TAKE 1 TABLET BY MOUTH EVERY DAY 30 tablet 3  . clobetasol ointment (TEMOVATE) 0.05 % Apply 1 application topically 2 (two) times daily. 30 g 0  . cyclobenzaprine (FLEXERIL) 10 MG tablet Take 1 tablet (10 mg total) by mouth 2 (two) times daily as needed for muscle spasms. 40 tablet 1  . hydrochlorothiazide (MICROZIDE) 12.5 MG capsule Take 1 capsule (12.5 mg total) by mouth as needed. 90 capsule 1  . HYDROcodone-acetaminophen (NORCO/VICODIN) 5-325 MG per tablet Take by mouth. TAKE 1-2 TABLETS EVERY 4-6 HOURS AS NEEDED FOR PAIN.  0  . hydrOXYzine (ATARAX/VISTARIL) 25 MG tablet Take 1 tablet (25 mg total) by mouth every 8 (eight) hours as  needed for itching. 21 tablet 0  . ibuprofen (ADVIL,MOTRIN) 600 MG tablet Take 1 tablet (600 mg total) by mouth every 6 (six) hours as needed. 30 tablet 0  . Multiple Vitamin (MULTIVITAMIN) tablet Take 1 tablet by mouth daily.    . Omega-3 Fatty Acids (OMEGA 3 PO) Take by mouth 2 (two) times daily.    Marland Kitchen oxybutynin (DITROPAN) 5 MG tablet Take 1 tablet (5 mg total) by mouth daily as needed. 90 tablet 1  . ranitidine (ZANTAC) 150 MG capsule Take 1 capsule (150 mg total) by mouth 2 (two) times daily. 60 capsule 0  . traMADol (ULTRAM) 50 MG tablet Take 1 tablet (50 mg total) by mouth every 6 (six) hours as needed. 16 tablet 0  . amphetamine-dextroamphetamine (ADDERALL) 5 MG tablet Take 1 tablet (5 mg total) by mouth 2 (two) times daily with a meal. June 2016 60 tablet 0  . doxycycline (VIBRAMYCIN) 100 MG capsule Take 1 capsule  (100 mg total) by mouth 2 (two) times daily. (Patient not taking: Reported on 05/07/2015) 20 capsule 0  . gabapentin (NEURONTIN) 100 MG capsule Take 1 capsule (100 mg total) by mouth at bedtime. (Patient not taking: Reported on 05/07/2015) 30 capsule 0   No facility-administered medications prior to visit.    Allergies  Allergen Reactions  . Shellfish Allergy Rash  . Penicillins Itching and Swelling  . Latex Rash    Review of Systems  Constitutional: Negative for fever and malaise/fatigue.  HENT: Negative for congestion.   Eyes: Negative for discharge.  Respiratory: Negative for shortness of breath.   Cardiovascular: Negative for chest pain, palpitations and leg swelling.  Gastrointestinal: Negative for nausea and abdominal pain.  Genitourinary: Negative for dysuria.  Musculoskeletal: Positive for myalgias, back pain and neck pain. Negative for falls.  Skin: Negative for rash.  Neurological: Positive for tingling and focal weakness. Negative for loss of consciousness and headaches.  Endo/Heme/Allergies: Negative for environmental allergies.  Psychiatric/Behavioral: Negative for depression. The patient is not nervous/anxious.        Objective:    Physical Exam  Constitutional: She is oriented to person, place, and time. She appears well-developed and well-nourished. No distress.  HENT:  Head: Normocephalic and atraumatic.  Nose: Nose normal.  Eyes: Right eye exhibits no discharge. Left eye exhibits no discharge.  Neck: Normal range of motion. Neck supple.  Cardiovascular: Normal rate and regular rhythm.   No murmur heard. Pulmonary/Chest: Effort normal and breath sounds normal.  Abdominal: Soft. Bowel sounds are normal. There is no tenderness.  Musculoskeletal: Normal range of motion. She exhibits tenderness. She exhibits no edema.  Tender SCM b/l  Neurological: She is alert and oriented to person, place, and time. She has normal reflexes. She displays normal reflexes. No  cranial nerve deficit.  Skin: Skin is warm and dry.  Psychiatric: She has a normal mood and affect.  Nursing note and vitals reviewed.   BP 100/52 mmHg  Pulse 62  Temp(Src) 98.9 F (37.2 C) (Oral)  Ht 5' 2.5" (1.588 m)  Wt 145 lb 12.8 oz (66.134 kg)  BMI 26.23 kg/m2  SpO2 100% Wt Readings from Last 3 Encounters:  05/07/15 145 lb 12.8 oz (66.134 kg)  04/21/15 143 lb 9.6 oz (65.137 kg)  03/18/15 139 lb (63.05 kg)     Lab Results  Component Value Date   WBC 4.6 10/31/2013   HGB 16.7* 10/31/2013   HCT 49.0* 10/31/2013   PLT 215 10/31/2013   GLUCOSE 93 10/31/2013  NA 143 10/31/2013   K 3.8 10/31/2013   CL 102 10/31/2013   CREATININE 0.90 10/31/2013   BUN 7 10/31/2013   CO2 26 04/12/2011    No results found for: TSH Lab Results  Component Value Date   WBC 4.6 10/31/2013   HGB 16.7* 10/31/2013   HCT 49.0* 10/31/2013   MCV 81.9 10/31/2013   PLT 215 10/31/2013   Lab Results  Component Value Date   NA 143 10/31/2013   K 3.8 10/31/2013   CO2 26 04/12/2011   GLUCOSE 93 10/31/2013   BUN 7 10/31/2013   CREATININE 0.90 10/31/2013   CALCIUM 9.9 04/12/2011       Assessment & Plan:   Problem List Items Addressed This Visit    Paresthesias    Throughout arms intermittently likely multifactorial involving neck and carpal tunnel. Encouraged icing topical treatments and braces for wrists and proceed with imaging      Relevant Medications   predniSONE (DELTASONE) 20 MG tablet   Neck pain    Xray previously confirmed DDD disease pain is worsening with radicular symptoms proceed with MRI encouraged topical treatments prn      Hypertension    Well controlled, no changes to meds. Encouraged heart healthy diet such as the DASH diet and exercise as tolerated.       Dyshidrotic eczema    Using topical steroids with some relief      ADD (attention deficit disorder)   Relevant Medications   amphetamine-dextroamphetamine (ADDERALL) 5 MG tablet    Other Visit  Diagnoses    Bilateral low back pain, with sciatica presence unspecified    -  Primary    Relevant Medications    predniSONE (DELTASONE) 20 MG tablet    Other Relevant Orders    DG Lumbar Spine Complete    Dermatitis        Relevant Orders    DG Lumbar Spine Complete    Pain, neck        Relevant Orders    DG Lumbar Spine Complete       I have discontinued Ms. Pettis's amphetamine-dextroamphetamine, gabapentin, and doxycycline. I have also changed her amphetamine-dextroamphetamine. Additionally, I am having her start on predniSONE. Lastly, I am having her maintain her ibuprofen, aspirin, Vitamin D, cyclobenzaprine, hydrochlorothiazide, multivitamin, Omega-3 Fatty Acids (OMEGA 3 PO), oxybutynin, cetirizine, HYDROcodone-acetaminophen, citalopram, clobetasol ointment, ranitidine, hydrOXYzine, and traMADol.  Meds ordered this encounter  Medications  . predniSONE (DELTASONE) 20 MG tablet    Sig: 1 tab po bid x 3 days then 1 tab po daily x 3 days then 1/2 tab po daily x 3 day    Dispense:  11 tablet    Refill:  0  . DISCONTD: amphetamine-dextroamphetamine (ADDERALL) 5 MG tablet    Sig: Take 1 tablet (5 mg total) by mouth 2 (two) times daily with a meal. September 2016    Dispense:  60 tablet    Refill:  0  . amphetamine-dextroamphetamine (ADDERALL) 5 MG tablet    Sig: Take 1 tablet (5 mg total) by mouth 2 (two) times daily with a meal. october 2016    Dispense:  60 tablet    Refill:  0     Danise Edge, MD

## 2015-05-23 ENCOUNTER — Ambulatory Visit (HOSPITAL_BASED_OUTPATIENT_CLINIC_OR_DEPARTMENT_OTHER)
Admission: RE | Admit: 2015-05-23 | Discharge: 2015-05-23 | Disposition: A | Discharge status: Home / Self Care | Payer: Managed Care, Other (non HMO) | Source: Ambulatory Visit | Attending: Family Medicine | Admitting: Family Medicine

## 2015-05-23 DIAGNOSIS — M542 Cervicalgia: Secondary | ICD-10-CM

## 2015-05-23 DIAGNOSIS — L309 Dermatitis, unspecified: Secondary | ICD-10-CM

## 2015-05-23 DIAGNOSIS — M545 Low back pain: Secondary | ICD-10-CM

## 2015-05-26 ENCOUNTER — Telehealth: Payer: Self-pay | Admitting: Family Medicine

## 2015-05-26 ENCOUNTER — Other Ambulatory Visit: Payer: Self-pay | Admitting: Family Medicine

## 2015-05-26 DIAGNOSIS — R202 Paresthesia of skin: Secondary | ICD-10-CM

## 2015-05-26 DIAGNOSIS — M545 Low back pain: Secondary | ICD-10-CM

## 2015-05-26 MED ORDER — PREDNISONE 20 MG PO TABS: ORAL_TABLET | ORAL | Status: DC

## 2015-05-26 NOTE — Telephone Encounter (Signed)
Patient informed prednisone sent in .

## 2015-05-26 NOTE — Telephone Encounter (Signed)
Patient informed of order and agreed to do xray asap.

## 2015-05-26 NOTE — Telephone Encounter (Signed)
Can have one refill on the Prednisone as prescribed before but then if no better needs to come back in

## 2015-05-26 NOTE — Telephone Encounter (Signed)
The patient was prescribed prednisone at her last OV for a rash.  She states it did get better, but not completely and is now coming back.  She would like prednisone called in again if possible for this rash.

## 2015-05-26 NOTE — Telephone Encounter (Signed)
-----   Message from Bradd Canary, MD sent at 05/26/2015 12:54 PM EDT ----- Regarding: RE: mri I have ordered an MRI of lower back but have also ordered a xray because insurance usually requires this to approve the MRI. Please let her know to proceed with that at her earliest convenience ----- Message -----    From: Pincus Sanes, CMA    Sent: 05/26/2015  10:48 AM      To: Bradd Canary, MD Subject: FW: mri                                        The patient stated it was more lower back/lower extremeties. ----- Message -----    From: Bradd Canary, MD    Sent: 05/25/2015  12:57 PM      To: Vladimir Crofts Atlee Kluth, CMA Subject: FW: mri                                        Check with patient she has clinical reasons to have cervical and lumbar mri done. Clarify which one she wants to proceed with. Seems like I misunderstood and ordered wrong one. I will then order the one she is would like to proceed with. Dr B ----- Message -----    From: Eugene Garnet    Sent: 05/25/2015   8:27 AM      To: Eli Phillips, MD Subject: mri                                            Ms. Pletz came in Saturday, 05/23/15, for her Cervical MRI .   Ms. Schrader cancelled the MRI  and told the technologist that the MRI order was wrong and that it should be a lumbar MRI and not a cervical.  Stated she would call your office Monday.    Thanks, Hanley Seamen [facility]

## 2015-05-30 ENCOUNTER — Ambulatory Visit (HOSPITAL_BASED_OUTPATIENT_CLINIC_OR_DEPARTMENT_OTHER)
Admission: RE | Admit: 2015-05-30 | Discharge: 2015-05-30 | Disposition: A | Discharge status: Home / Self Care | Payer: Managed Care, Other (non HMO) | Source: Ambulatory Visit | Attending: Family Medicine | Admitting: Family Medicine

## 2015-05-30 DIAGNOSIS — M545 Low back pain: Secondary | ICD-10-CM

## 2015-05-30 DIAGNOSIS — G8929 Other chronic pain: Secondary | ICD-10-CM | POA: Insufficient documentation

## 2015-05-30 DIAGNOSIS — M4806 Spinal stenosis, lumbar region: Secondary | ICD-10-CM | POA: Insufficient documentation

## 2015-05-30 DIAGNOSIS — M4686 Other specified inflammatory spondylopathies, lumbar region: Secondary | ICD-10-CM | POA: Insufficient documentation

## 2015-05-30 DIAGNOSIS — M5416 Radiculopathy, lumbar region: Secondary | ICD-10-CM | POA: Insufficient documentation

## 2015-06-01 ENCOUNTER — Telehealth: Payer: Self-pay | Admitting: Family Medicine

## 2015-06-01 ENCOUNTER — Other Ambulatory Visit: Payer: Self-pay | Admitting: Family Medicine

## 2015-06-01 DIAGNOSIS — M545 Low back pain, unspecified: Secondary | ICD-10-CM

## 2015-06-01 NOTE — Telephone Encounter (Signed)
Referral done

## 2015-06-01 NOTE — Telephone Encounter (Signed)
Based on MRI results from 05/30/15 the patient would like a referral for a neurosurgeon.

## 2015-06-01 NOTE — Telephone Encounter (Signed)
Patient informed. 

## 2015-06-01 NOTE — Telephone Encounter (Signed)
Caller name: Feliz Beam NCDDS   Relationship to patient:  Can be reached: [phone]   Reason for call: He is following up on paperwork for pt's disability claim that was sent in on 9/28.  He want to confirm receipt.

## 2015-06-02 NOTE — Telephone Encounter (Signed)
This has not been received. JG//CMA

## 2015-07-13 ENCOUNTER — Other Ambulatory Visit (HOSPITAL_BASED_OUTPATIENT_CLINIC_OR_DEPARTMENT_OTHER): Payer: Self-pay | Admitting: Neurosurgery

## 2015-07-13 DIAGNOSIS — M47812 Spondylosis without myelopathy or radiculopathy, cervical region: Secondary | ICD-10-CM

## 2015-07-13 DIAGNOSIS — G959 Disease of spinal cord, unspecified: Secondary | ICD-10-CM

## 2015-08-05 ENCOUNTER — Encounter: Payer: Managed Care, Other (non HMO) | Admitting: Medical

## 2015-08-05 ENCOUNTER — Telehealth: Payer: Self-pay | Admitting: Medical

## 2015-08-05 NOTE — Progress Notes (Signed)
This encounter was created in error - please disregard.

## 2015-08-06 ENCOUNTER — Encounter: Payer: Self-pay | Admitting: Medical

## 2015-08-06 ENCOUNTER — Ambulatory Visit (INDEPENDENT_AMBULATORY_CARE_PROVIDER_SITE_OTHER): Payer: Managed Care, Other (non HMO) | Admitting: Medical

## 2015-08-06 VITALS — BP 100/60 | HR 81 | Temp 98.0°F | Ht 62.5 in | Wt 142.4 lb

## 2015-08-06 DIAGNOSIS — J029 Acute pharyngitis, unspecified: Secondary | ICD-10-CM

## 2015-08-06 DIAGNOSIS — M609 Myositis, unspecified: Secondary | ICD-10-CM | POA: Diagnosis not present

## 2015-08-06 DIAGNOSIS — R519 Headache, unspecified: Secondary | ICD-10-CM

## 2015-08-06 DIAGNOSIS — R51 Headache: Secondary | ICD-10-CM | POA: Diagnosis not present

## 2015-08-06 DIAGNOSIS — M791 Myalgia: Secondary | ICD-10-CM | POA: Diagnosis not present

## 2015-08-06 DIAGNOSIS — L739 Follicular disorder, unspecified: Secondary | ICD-10-CM

## 2015-08-06 DIAGNOSIS — R5383 Other fatigue: Secondary | ICD-10-CM

## 2015-08-06 DIAGNOSIS — IMO0001 Reserved for inherently not codable concepts without codable children: Secondary | ICD-10-CM

## 2015-08-06 DIAGNOSIS — H6691 Otitis media, unspecified, right ear: Secondary | ICD-10-CM

## 2015-08-06 LAB — POCT RAPID STREP A (OFFICE): RAPID STREP A SCREEN: NEGATIVE

## 2015-08-06 MED ORDER — AZITHROMYCIN 250 MG PO TABS: ORAL_TABLET | ORAL | Status: DC

## 2015-08-06 MED ORDER — KETOROLAC TROMETHAMINE 60 MG/2ML IM SOLN
60.0000 mg | Freq: Once | INTRAMUSCULAR | Status: AC
  Administered 2015-08-06: 60 mg via INTRAMUSCULAR

## 2015-08-06 MED ORDER — MUPIROCIN CALCIUM 2 % EX CREA: 1.0000 "application " | TOPICAL_CREAM | Freq: Two times a day (BID) | CUTANEOUS | Status: DC

## 2015-08-06 MED ORDER — OSELTAMIVIR PHOSPHATE 75 MG PO CAPS: 75.0000 mg | ORAL_CAPSULE | Freq: Two times a day (BID) | ORAL | Status: DC

## 2015-08-06 NOTE — Patient Instructions (Addendum)
For flu like syndrome will rx tamiflu.  For ha today we gave toradol 60 mg im.  You start motrin otc(tomorrow) for myaglias.  For rt om on exam will rx azithromycin.(stop celexa while on). Then resume after antibiotic finished.  For folliculitis of neck will rx mupirocin.  Rest hydrate well.  Follow up in 7 days or as needed.  Both rapid strep and flu negative.

## 2015-08-06 NOTE — Progress Notes (Signed)
   Subjective:    Patient ID: Suzanne MaladyBonnie Lou Mifflin, female    DOB: [birth year], 59 y.o.   MRN: [id number]  HPI  Pt states that she has some achiness/myalgia all over on Tuesday. Also some ha with some fatigue. She feels mild nausea. On Wednesday bodyaches were severe.   Pt felt mild congestion nasally. When she blew her nose got little mucous.  No abdomen pain.  Pt sates that rash that I treated her for got better with steroid cream and hydoxyzine. She never went to dermatologist because she got better.   Pt has mild tender node behind her rt ear. No rt ear pain.     Review of Systems  Constitutional: Positive for fever and fatigue. Negative for chills.       Fever early on when achiness started.  HENT: Negative for congestion, postnasal drip, rhinorrhea, sinus pressure and sore throat.        By exam throat looks red.  Respiratory: Negative for cough, choking and wheezing.   Cardiovascular: Negative for chest pain and palpitations.  Gastrointestinal: Positive for abdominal pain.  Musculoskeletal: Positive for myalgias.  Skin:       Rash on rt side posterior neck.  Neurological: Positive for headaches.  Hematological: Positive for adenopathy.       Objective:   Physical Exam  General  Mental Status - Alert. General Appearance - Well groomed. Not in acute distress.  Skin Rt side of neck scattered mild folliculitis.  HEENT Head- Normal. Ear Auditory Canal - Left- Normal. Right - Normal.Tympanic Membrane- Left- Normal. Right- rt tm red.(behind and in hairline area one tender lymph node.) Eye Sclera/Conjunctiva- Left- Normal. Right- Normal. Nose & Sinuses Nasal Mucosa- Left-  Boggy and Congested. Right-  Boggy and  Congested.Bilateral maxillary and frontal sinus pressure. Mouth & Throat Lips: Upper Lip- Normal: no dryness, cracking, pallor, cyanosis, or vesicular eruption. Lower Lip-Normal: no dryness, cracking, pallor, cyanosis or vesicular eruption. Buccal Mucosa-  Bilateral- No Aphthous ulcers. Oropharynx- No Discharge or Erythema. Tonsils: Characteristics- Bilateral- mild  Erythema and  Congestion. Size/Enlargement- Bilateral- No enlargement. Discharge- bilateral-None.  Neck Neck- Supple. No Masses.   Chest and Lung Exam Auscultation: Breath Sounds:-Clear even and unlabored.  Cardiovascular Auscultation:Rythm- Regular, rate and rhythm. Murmurs & Other Heart Sounds:Ausculatation of the heart reveal- No Murmurs.  Lymphatic Head & Neck General Head & Neck Lymphatics: Bilateral: Description- No Localized lymphadenopathy.Only on lymph node rt side of neck posterior aspect.        Assessment & Plan:  For flu like syndrome will rx tamiflu.  For ha today we gave toradol 60 mg im.  You start motrin otc(tomorrow) for myaglias.  For rt om on exam will rx azithromycin.stopping celexa while on zpack. Then will restart.  For folliculitis of neck will rx mupirocin.  Rest hydrate well.  Follow up in 7 days or as needed.

## 2015-08-06 NOTE — Progress Notes (Signed)
Pre visit review using our clinic review tool, if applicable. No additional management support is needed unless otherwise documented below in the visit note. 

## 2015-08-10 NOTE — Telephone Encounter (Signed)
Pt was no show 08/05/15 11:30am for acute appt, pt came in 08/06/15, charge or no charge?

## 2015-08-10 NOTE — Telephone Encounter (Signed)
No charge. 

## 2015-08-11 ENCOUNTER — Telehealth: Payer: Self-pay | Admitting: Family Medicine

## 2015-08-11 MED ORDER — HYDROCODONE-ACETAMINOPHEN 5-325 MG PO TABS: ORAL_TABLET | ORAL | Status: DC

## 2015-08-11 NOTE — Telephone Encounter (Signed)
She is wanting hydrocodone.  Looks like last filled in July 2016 and last OV 05/07/2015.  Patient leaving today around 3 and would like a response

## 2015-08-11 NOTE — Telephone Encounter (Signed)
Pt having pain in hands and states that she has been to neuro and is waiting for ins to approve another MRI. Pt is requesting pain meds be called in for her because she is going out of town today at 3:00pm. Advised her that controlled RX has to be picked up and pt states that she's had it called in before. Pt last seen for hands here in 04/2015. Pt phone# [phone]. Requesting call Asap as she is going out of town.

## 2015-08-11 NOTE — Telephone Encounter (Signed)
Printed and on counter for signature.   Called the patient informed to pickup at the front desk and to inform of PCP instructions regarding future refills.  The patient has appt. Scheduled for 08/22/2015.

## 2015-08-11 NOTE — Telephone Encounter (Signed)
She can have Hydrocodone same strength, same sig, #30 this time but warn her since it is a controlled substance she will have to come in to discuss if she needs to continue med and she will need to sign a drug contract and do a UDS

## 2015-08-19 ENCOUNTER — Telehealth: Payer: Self-pay | Admitting: Family Medicine

## 2015-08-19 NOTE — Telephone Encounter (Signed)
Pt never picked up rx printed on 04-30-15, rx shredded

## 2015-08-26 ENCOUNTER — Telehealth: Payer: Self-pay

## 2015-08-26 NOTE — Telephone Encounter (Signed)
Pre-Visit call made . Left message for return call.

## 2015-08-27 ENCOUNTER — Encounter: Payer: Self-pay | Admitting: Family Medicine

## 2015-08-27 ENCOUNTER — Ambulatory Visit (HOSPITAL_BASED_OUTPATIENT_CLINIC_OR_DEPARTMENT_OTHER)
Admission: RE | Admit: 2015-08-27 | Discharge: 2015-08-27 | Disposition: A | Discharge status: Home / Self Care | Payer: Managed Care, Other (non HMO) | Source: Ambulatory Visit | Attending: Family Medicine | Admitting: Family Medicine

## 2015-08-27 ENCOUNTER — Other Ambulatory Visit (HOSPITAL_COMMUNITY)
Admission: RE | Admit: 2015-08-27 | Discharge: 2015-08-27 | Disposition: A | Discharge status: Home / Self Care | Payer: Managed Care, Other (non HMO) | Source: Ambulatory Visit | Attending: Family Medicine | Admitting: Family Medicine

## 2015-08-27 ENCOUNTER — Ambulatory Visit (INDEPENDENT_AMBULATORY_CARE_PROVIDER_SITE_OTHER): Payer: Managed Care, Other (non HMO) | Admitting: Family Medicine

## 2015-08-27 VITALS — BP 118/82 | HR 87 | Temp 97.9°F | Ht 63.0 in | Wt 147.0 lb

## 2015-08-27 DIAGNOSIS — E782 Mixed hyperlipidemia: Secondary | ICD-10-CM | POA: Diagnosis not present

## 2015-08-27 DIAGNOSIS — Z0000 Encounter for general adult medical examination without abnormal findings: Secondary | ICD-10-CM

## 2015-08-27 DIAGNOSIS — Z1211 Encounter for screening for malignant neoplasm of colon: Secondary | ICD-10-CM | POA: Diagnosis not present

## 2015-08-27 DIAGNOSIS — R519 Headache, unspecified: Secondary | ICD-10-CM

## 2015-08-27 DIAGNOSIS — R202 Paresthesia of skin: Secondary | ICD-10-CM

## 2015-08-27 DIAGNOSIS — Z1239 Encounter for other screening for malignant neoplasm of breast: Secondary | ICD-10-CM | POA: Insufficient documentation

## 2015-08-27 DIAGNOSIS — M542 Cervicalgia: Secondary | ICD-10-CM | POA: Diagnosis not present

## 2015-08-27 DIAGNOSIS — N3281 Overactive bladder: Secondary | ICD-10-CM

## 2015-08-27 DIAGNOSIS — N760 Acute vaginitis: Secondary | ICD-10-CM | POA: Diagnosis not present

## 2015-08-27 DIAGNOSIS — L301 Dyshidrosis [pompholyx]: Secondary | ICD-10-CM | POA: Diagnosis not present

## 2015-08-27 DIAGNOSIS — I10 Essential (primary) hypertension: Secondary | ICD-10-CM

## 2015-08-27 DIAGNOSIS — Z0001 Encounter for general adult medical examination with abnormal findings: Secondary | ICD-10-CM

## 2015-08-27 DIAGNOSIS — Z800 Family history of malignant neoplasm of digestive organs: Secondary | ICD-10-CM | POA: Diagnosis not present

## 2015-08-27 DIAGNOSIS — Z113 Encounter for screening for infections with a predominantly sexual mode of transmission: Secondary | ICD-10-CM | POA: Diagnosis not present

## 2015-08-27 DIAGNOSIS — Z23 Encounter for immunization: Secondary | ICD-10-CM | POA: Diagnosis not present

## 2015-08-27 DIAGNOSIS — Z1231 Encounter for screening mammogram for malignant neoplasm of breast: Secondary | ICD-10-CM | POA: Diagnosis not present

## 2015-08-27 DIAGNOSIS — R51 Headache: Secondary | ICD-10-CM | POA: Diagnosis not present

## 2015-08-27 LAB — CBC
HCT: 42 % (ref 36.0–46.0)
HEMOGLOBIN: 13.3 g/dL (ref 12.0–15.0)
MCHC: 31.6 g/dL (ref 30.0–36.0)
MCV: 85.4 fl (ref 78.0–100.0)
PLATELETS: 276 10*3/uL (ref 150.0–400.0)
RBC: 4.91 Mil/uL (ref 3.87–5.11)
RDW: 14.3 % (ref 11.5–15.5)
WBC: 5.9 10*3/uL (ref 4.0–10.5)

## 2015-08-27 LAB — COMPREHENSIVE METABOLIC PANEL
ALBUMIN: 4.2 g/dL (ref 3.5–5.2)
ALK PHOS: 70 U/L (ref 39–117)
ALT: 10 U/L (ref 0–35)
AST: 16 U/L (ref 0–37)
BILIRUBIN TOTAL: 0.3 mg/dL (ref 0.2–1.2)
BUN: 12 mg/dL (ref 6–23)
CO2: 31 mEq/L (ref 19–32)
Calcium: 9.7 mg/dL (ref 8.4–10.5)
Chloride: 105 mEq/L (ref 96–112)
Creatinine, Ser: 0.84 mg/dL (ref 0.40–1.20)
GFR: 89.43 mL/min (ref >60.00)
GLUCOSE: 78 mg/dL (ref 70–99)
POTASSIUM: 4.1 meq/L (ref 3.5–5.1)
Sodium: 141 mEq/L (ref 135–145)
TOTAL PROTEIN: 7.6 g/dL (ref 6.0–8.3)

## 2015-08-27 LAB — HIV ANTIBODY (ROUTINE TESTING W REFLEX): HIV: NONREACTIVE

## 2015-08-27 LAB — LIPID PANEL
CHOLESTEROL: 248 mg/dL — AB (ref 0–200)
HDL: 73.5 mg/dL (ref >39.00)
LDL Cholesterol: 164 mg/dL — ABNORMAL HIGH (ref 0–99)
NONHDL: 174.36
Total CHOL/HDL Ratio: 3
Triglycerides: 51 mg/dL (ref 0.0–149.0)
VLDL: 10.2 mg/dL (ref 0.0–40.0)

## 2015-08-27 LAB — VITAMIN D 25 HYDROXY (VIT D DEFICIENCY, FRACTURES): VITD: 39.75 ng/mL (ref 30.00–100.00)

## 2015-08-27 LAB — TSH: TSH: 0.62 u[IU]/mL (ref 0.35–4.50)

## 2015-08-27 MED ORDER — HYDROCODONE-ACETAMINOPHEN 10-325 MG PO TABS: 1.0000 | ORAL_TABLET | Freq: Three times a day (TID) | ORAL | Status: DC | PRN

## 2015-08-27 MED ORDER — IBUPROFEN 600 MG PO TABS: 600.0000 mg | ORAL_TABLET | Freq: Four times a day (QID) | ORAL | Status: DC | PRN

## 2015-08-27 NOTE — Progress Notes (Signed)
Pre visit review using our clinic review tool, if applicable. No additional management support is needed unless otherwise documented below in the visit note. 

## 2015-08-27 NOTE — Assessment & Plan Note (Signed)
Used some soap and has some vaginal discharge with odor and itching, start a probiotic.and check cultures can cleanse with distilled white vinegar or witch hazel

## 2015-08-27 NOTE — Patient Instructions (Addendum)
start a probiotic.and check cultures can cleanse with distilled white vinegar or witch hazel. Probiotics such as [employer] or [place] locally or online at [email or web address]. 10 strain probiotic by [place].  Encouraged increased hydration, 64 ounces of clear fluids daily. Minimize alcohol and caffeine. Eat small frequent meals with lean proteins and complex carbs. Avoid high and low blood sugars. Get adequate sleep, 7-8 hours a night. Needs exercise daily preferably in the morning. Salon pas patches to neck   Preventive Care for Adults, Female A healthy lifestyle and preventive care can promote health and wellness. Preventive health guidelines for women include the following key practices.  A routine yearly physical is a good way to check with your health care provider about your health and preventive screening. It is a chance to share any concerns and updates on your health and to receive a thorough exam.  Visit your dentist for a routine exam and preventive care every 6 months. Brush your teeth twice a day and floss once a day. Good oral hygiene prevents tooth decay and gum disease.  The frequency of eye exams is based on your age, health, family medical history, use of contact lenses, and other factors. Follow your health care provider's recommendations for frequency of eye exams.  Eat a healthy diet. Foods like vegetables, fruits, whole grains, low-fat dairy products, and lean protein foods contain the nutrients you need without too many calories. Decrease your intake of foods high in solid fats, added sugars, and salt. Eat the right amount of calories for you.Get information about a proper diet from your health care provider, if necessary.  Regular physical exercise is one of the most important things you can do for your health. Most adults should get at least 150 minutes of moderate-intensity exercise (any activity that increases your heart rate and causes you to  sweat) each week. In addition, most adults need muscle-strengthening exercises on 2 or more days a week.  Maintain a healthy weight. The body mass index (BMI) is a screening tool to identify possible weight problems. It provides an estimate of body fat based on height and weight. Your health care provider can find your BMI and can help you achieve or maintain a healthy weight.For adults 20 years and older:  A BMI below 18.5 is considered underweight.  A BMI of 18.5 to 24.9 is normal.  A BMI of 25 to 29.9 is considered overweight.  A BMI of 30 and above is considered obese.  Maintain normal blood lipids and cholesterol levels by exercising and minimizing your intake of saturated fat. Eat a balanced diet with plenty of fruit and vegetables. Blood tests for lipids and cholesterol should begin at age 2 and be repeated every 5 years. If your lipid or cholesterol levels are high, you are over 50, or you are at high risk for heart disease, you may need your cholesterol levels checked more frequently.Ongoing high lipid and cholesterol levels should be treated with medicines if diet and exercise are not working.  If you smoke, find out from your health care provider how to quit. If you do not use tobacco, do not start.  Lung cancer screening is recommended for adults aged 54-80 years who are at high risk for developing lung cancer because of a history of smoking. A yearly low-dose CT scan of the lungs is recommended for people who have at least a 30-pack-year history of smoking and are a current smoker or have quit within the past 15  years. A pack year of smoking is smoking an average of 1 pack of cigarettes a day for 1 year (for example: 1 pack a day for 30 years or 2 packs a day for 15 years). Yearly screening should continue until the smoker has stopped smoking for at least 15 years. Yearly screening should be stopped for people who develop a health problem that would prevent them from having lung  cancer treatment.  If you are pregnant, do not drink alcohol. If you are breastfeeding, be very cautious about drinking alcohol. If you are not pregnant and choose to drink alcohol, do not have more than 1 drink per day. One drink is considered to be 12 ounces (355 mL) of beer, 5 ounces (148 mL) of wine, or 1.5 ounces (44 mL) of liquor.  Avoid use of street drugs. Do not share needles with anyone. Ask for help if you need support or instructions about stopping the use of drugs.  High blood pressure causes heart disease and increases the risk of stroke. Your blood pressure should be checked at least every 1 to 2 years. Ongoing high blood pressure should be treated with medicines if weight loss and exercise do not work.  If you are 62-9 years old, ask your health care provider if you should take aspirin to prevent strokes.  Diabetes screening is done by taking a blood sample to check your blood glucose level after you have not eaten for a certain period of time (fasting). If you are not overweight and you do not have risk factors for diabetes, you should be screened once every 3 years starting at age 62. If you are overweight or obese and you are 92-65 years of age, you should be screened for diabetes every year as part of your cardiovascular risk assessment.  Breast cancer screening is essential preventive care for women. You should practice "breast self-awareness." This means understanding the normal appearance and feel of your breasts and may include breast self-examination. Any changes detected, no matter how small, should be reported to a health care provider. Women in their 55s and 30s should have a clinical breast exam (CBE) by a health care provider as part of a regular health exam every 1 to 3 years. After age 72, women should have a CBE every year. Starting at age 69, women should consider having a mammogram (breast X-ray test) every year. Women who have a family history of breast cancer should  talk to their health care provider about genetic screening. Women at a high risk of breast cancer should talk to their health care providers about having an MRI and a mammogram every year.  Breast cancer gene (BRCA)-related cancer risk assessment is recommended for women who have family members with BRCA-related cancers. BRCA-related cancers include breast, ovarian, tubal, and peritoneal cancers. Having family members with these cancers may be associated with an increased risk for harmful changes (mutations) in the breast cancer genes BRCA1 and BRCA2. Results of the assessment will determine the need for genetic counseling and BRCA1 and BRCA2 testing.  Your health care provider may recommend that you be screened regularly for cancer of the pelvic organs (ovaries, uterus, and vagina). This screening involves a pelvic examination, including checking for microscopic changes to the surface of your cervix (Pap test). You may be encouraged to have this screening done every 3 years, beginning at age 49.  For women ages 40-65, health care providers may recommend pelvic exams and Pap testing every 3 years, or they  may recommend the Pap and pelvic exam, combined with testing for human papilloma virus (HPV), every 5 years. Some types of HPV increase your risk of cervical cancer. Testing for HPV may also be done on women of any age with unclear Pap test results.  Other health care providers may not recommend any screening for nonpregnant women who are considered low risk for pelvic cancer and who do not have symptoms. Ask your health care provider if a screening pelvic exam is right for you.  If you have had past treatment for cervical cancer or a condition that could lead to cancer, you need Pap tests and screening for cancer for at least 20 years after your treatment. If Pap tests have been discontinued, your risk factors (such as having a new sexual partner) need to be reassessed to determine if screening should  resume. Some women have medical problems that increase the chance of getting cervical cancer. In these cases, your health care provider may recommend more frequent screening and Pap tests.  Colorectal cancer can be detected and often prevented. Most routine colorectal cancer screening begins at the age of 29 years and continues through age 46 years. However, your health care provider may recommend screening at an earlier age if you have risk factors for colon cancer. On a yearly basis, your health care provider may provide home test kits to check for hidden blood in the stool. Use of a small camera at the end of a tube, to directly examine the colon (sigmoidoscopy or colonoscopy), can detect the earliest forms of colorectal cancer. Talk to your health care provider about this at age 64, when routine screening begins. Direct exam of the colon should be repeated every 5-10 years through age 72 years, unless early forms of precancerous polyps or small growths are found.  People who are at an increased risk for hepatitis B should be screened for this virus. You are considered at high risk for hepatitis B if:  You were born in a country where hepatitis B occurs often. Talk with your health care provider about which countries are considered high risk.  Your parents were born in a high-risk country and you have not received a shot to protect against hepatitis B (hepatitis B vaccine).  You have HIV or AIDS.  You use needles to inject street drugs.  You live with, or have sex with, someone who has hepatitis B.  You get hemodialysis treatment.  You take certain medicines for conditions like cancer, organ transplantation, and autoimmune conditions.  Hepatitis C blood testing is recommended for all people born from [birth year] through 1965 and any individual with known risks for hepatitis C.  Practice safe sex. Use condoms and avoid high-risk sexual practices to reduce the spread of sexually transmitted  infections (STIs). STIs include gonorrhea, chlamydia, syphilis, trichomonas, herpes, HPV, and human immunodeficiency virus (HIV). Herpes, HIV, and HPV are viral illnesses that have no cure. They can result in disability, cancer, and death.  You should be screened for sexually transmitted illnesses (STIs) including gonorrhea and chlamydia if:  You are sexually active and are younger than 24 years.  You are older than 24 years and your health care provider tells you that you are at risk for this type of infection.  Your sexual activity has changed since you were last screened and you are at an increased risk for chlamydia or gonorrhea. Ask your health care provider if you are at risk.  If you are at risk of being  infected with HIV, it is recommended that you take a prescription medicine daily to prevent HIV infection. This is called preexposure prophylaxis (PrEP). You are considered at risk if:  You are sexually active and do not regularly use condoms or know the HIV status of your partner(s).  You take drugs by injection.  You are sexually active with a partner who has HIV.  Talk with your health care provider about whether you are at high risk of being infected with HIV. If you choose to begin PrEP, you should first be tested for HIV. You should then be tested every 3 months for as long as you are taking PrEP.  Osteoporosis is a disease in which the bones lose minerals and strength with aging. This can result in serious bone fractures or breaks. The risk of osteoporosis can be identified using a bone density scan. Women ages 21 years and over and women at risk for fractures or osteoporosis should discuss screening with their health care providers. Ask your health care provider whether you should take a calcium supplement or vitamin D to reduce the rate of osteoporosis.  Menopause can be associated with physical symptoms and risks. Hormone replacement therapy is available to decrease symptoms  and risks. You should talk to your health care provider about whether hormone replacement therapy is right for you.  Use sunscreen. Apply sunscreen liberally and repeatedly throughout the day. You should seek shade when your shadow is shorter than you. Protect yourself by wearing long sleeves, pants, a wide-brimmed hat, and sunglasses year round, whenever you are outdoors.  Once a month, do a whole body skin exam, using a mirror to look at the skin on your back. Tell your health care provider of new moles, moles that have irregular borders, moles that are larger than a pencil eraser, or moles that have changed in shape or color.  Stay current with required vaccines (immunizations).  Influenza vaccine. All adults should be immunized every year.  Tetanus, diphtheria, and acellular pertussis (Td, Tdap) vaccine. Pregnant women should receive 1 dose of Tdap vaccine during each pregnancy. The dose should be obtained regardless of the length of time since the last dose. Immunization is preferred during the 27th-36th week of gestation. An adult who has not previously received Tdap or who does not know her vaccine status should receive 1 dose of Tdap. This initial dose should be followed by tetanus and diphtheria toxoids (Td) booster doses every 10 years. Adults with an unknown or incomplete history of completing a 3-dose immunization series with Td-containing vaccines should begin or complete a primary immunization series including a Tdap dose. Adults should receive a Td booster every 10 years.  Varicella vaccine. An adult without evidence of immunity to varicella should receive 2 doses or a second dose if she has previously received 1 dose. Pregnant females who do not have evidence of immunity should receive the first dose after pregnancy. This first dose should be obtained before leaving the health care facility. The second dose should be obtained 4-8 weeks after the first dose.  Human papillomavirus (HPV)  vaccine. Females aged 13-26 years who have not received the vaccine previously should obtain the 3-dose series. The vaccine is not recommended for use in pregnant females. However, pregnancy testing is not needed before receiving a dose. If a female is found to be pregnant after receiving a dose, no treatment is needed. In that case, the remaining doses should be delayed until after the pregnancy. Immunization is recommended for  any person with an immunocompromised condition through the age of 52 years if she did not get any or all doses earlier. During the 3-dose series, the second dose should be obtained 4-8 weeks after the first dose. The third dose should be obtained 24 weeks after the first dose and 16 weeks after the second dose.  Zoster vaccine. One dose is recommended for adults aged 61 years or older unless certain conditions are present.  Measles, mumps, and rubella (MMR) vaccine. Adults born before [birth year] generally are considered immune to measles and mumps. Adults born in [birth year] or later should have 1 or more doses of MMR vaccine unless there is a contraindication to the vaccine or there is laboratory evidence of immunity to each of the three diseases. A routine second dose of MMR vaccine should be obtained at least 28 days after the first dose for students attending postsecondary schools, health care workers, or international travelers. People who received inactivated measles vaccine or an unknown type of measles vaccine during 1963-1967 should receive 2 doses of MMR vaccine. People who received inactivated mumps vaccine or an unknown type of mumps vaccine before 1979 and are at high risk for mumps infection should consider immunization with 2 doses of MMR vaccine. For females of childbearing age, rubella immunity should be determined. If there is no evidence of immunity, females who are not pregnant should be vaccinated. If there is no evidence of immunity, females who are pregnant should delay  immunization until after pregnancy. Unvaccinated health care workers born before [birth year] who lack laboratory evidence of measles, mumps, or rubella immunity or laboratory confirmation of disease should consider measles and mumps immunization with 2 doses of MMR vaccine or rubella immunization with 1 dose of MMR vaccine.  Pneumococcal 13-valent conjugate (PCV13) vaccine. When indicated, a person who is uncertain of his immunization history and has no record of immunization should receive the PCV13 vaccine. All adults 48 years of age and older should receive this vaccine. An adult aged 92 years or older who has certain medical conditions and has not been previously immunized should receive 1 dose of PCV13 vaccine. This PCV13 should be followed with a dose of pneumococcal polysaccharide (PPSV23) vaccine. Adults who are at high risk for pneumococcal disease should obtain the PPSV23 vaccine at least 8 weeks after the dose of PCV13 vaccine. Adults older than 59 years of age who have normal immune system function should obtain the PPSV23 vaccine dose at least 1 year after the dose of PCV13 vaccine.  Pneumococcal polysaccharide (PPSV23) vaccine. When PCV13 is also indicated, PCV13 should be obtained first. All adults aged 58 years and older should be immunized. An adult younger than age 44 years who has certain medical conditions should be immunized. Any person who resides in a nursing home or long-term care facility should be immunized. An adult smoker should be immunized. People with an immunocompromised condition and certain other conditions should receive both PCV13 and PPSV23 vaccines. People with human immunodeficiency virus (HIV) infection should be immunized as soon as possible after diagnosis. Immunization during chemotherapy or radiation therapy should be avoided. Routine use of PPSV23 vaccine is not recommended for American Indians, [place] Natives, or people younger than 65 years unless there are medical  conditions that require PPSV23 vaccine. When indicated, people who have unknown immunization and have no record of immunization should receive PPSV23 vaccine. One-time revaccination 5 years after the first dose of PPSV23 is recommended for people aged 19-64 years who have chronic kidney  failure, nephrotic syndrome, asplenia, or immunocompromised conditions. People who received 1-2 doses of PPSV23 before age 67 years should receive another dose of PPSV23 vaccine at age 57 years or later if at least 5 years have passed since the previous dose. Doses of PPSV23 are not needed for people immunized with PPSV23 at or after age 57 years.  Meningococcal vaccine. Adults with asplenia or persistent complement component deficiencies should receive 2 doses of quadrivalent meningococcal conjugate (MenACWY-D) vaccine. The doses should be obtained at least 2 months apart. Microbiologists working with certain meningococcal bacteria, [employer] recruits, people at risk during an outbreak, and people who travel to or live in countries with a high rate of meningitis should be immunized. A first-year college student up through age 56 years who is living in a residence hall should receive a dose if she did not receive a dose on or after her 16th birthday. Adults who have certain high-risk conditions should receive one or more doses of vaccine.  Hepatitis A vaccine. Adults who wish to be protected from this disease, have certain high-risk conditions, work with hepatitis A-infected animals, work in hepatitis A research labs, or travel to or work in countries with a high rate of hepatitis A should be immunized. Adults who were previously unvaccinated and who anticipate close contact with an international adoptee during the first 60 days after arrival in the [place] from a country with a high rate of hepatitis A should be immunized.  Hepatitis B vaccine. Adults who wish to be protected from this disease, have certain high-risk  conditions, may be exposed to blood or other infectious body fluids, are household contacts or sex partners of hepatitis B positive people, are clients or workers in certain care facilities, or travel to or work in countries with a high rate of hepatitis B should be immunized.  Haemophilus influenzae type b (Hib) vaccine. A previously unvaccinated person with asplenia or sickle cell disease or having a scheduled splenectomy should receive 1 dose of Hib vaccine. Regardless of previous immunization, a recipient of a hematopoietic stem cell transplant should receive a 3-dose series 6-12 months after her successful transplant. Hib vaccine is not recommended for adults with HIV infection. Preventive Services / Frequency Ages 68 to 34 years  Blood pressure check.** / Every 3-5 years.  Lipid and cholesterol check.** / Every 5 years beginning at age 64.  Clinical breast exam.** / Every 3 years for women in their 106s and 37s.  BRCA-related cancer risk assessment.** / For women who have family members with a BRCA-related cancer (breast, ovarian, tubal, or peritoneal cancers).  Pap test.** / Every 2 years from ages 51 through 23. Every 3 years starting at age 13 through age 31 or 5 with a history of 3 consecutive normal Pap tests.  HPV screening.** / Every 3 years from ages 64 through ages 44 to 66 with a history of 3 consecutive normal Pap tests.  Hepatitis C blood test.** / For any individual with known risks for hepatitis C.  Skin self-exam. / Monthly.  Influenza vaccine. / Every year.  Tetanus, diphtheria, and acellular pertussis (Tdap, Td) vaccine.** / Consult your health care provider. Pregnant women should receive 1 dose of Tdap vaccine during each pregnancy. 1 dose of Td every 10 years.  Varicella vaccine.** / Consult your health care provider. Pregnant females who do not have evidence of immunity should receive the first dose after pregnancy.  HPV vaccine. / 3 doses over 6 months, if 26  and  younger. The vaccine is not recommended for use in pregnant females. However, pregnancy testing is not needed before receiving a dose.  Measles, mumps, rubella (MMR) vaccine.** / You need at least 1 dose of MMR if you were born in 1957 or later. You may also need a 2nd dose. For females of childbearing age, rubella immunity should be determined. If there is no evidence of immunity, females who are not pregnant should be vaccinated. If there is no evidence of immunity, females who are pregnant should delay immunization until after pregnancy.  Pneumococcal 13-valent conjugate (PCV13) vaccine.** / Consult your health care provider.  Pneumococcal polysaccharide (PPSV23) vaccine.** / 1 to 2 doses if you smoke cigarettes or if you have certain conditions.  Meningococcal vaccine.** / 1 dose if you are age 53 to 64 years and a Market researcher living in a residence hall, or have one of several medical conditions, you need to get vaccinated against meningococcal disease. You may also need additional booster doses.  Hepatitis A vaccine.** / Consult your health care provider.  Hepatitis B vaccine.** / Consult your health care provider.  Haemophilus influenzae type b (Hib) vaccine.** / Consult your health care provider. Ages 13 to 54 years  Blood pressure check.** / Every year.  Lipid and cholesterol check.** / Every 5 years beginning at age 17 years.  Lung cancer screening. / Every year if you are aged 25-80 years and have a 30-pack-year history of smoking and currently smoke or have quit within the past 15 years. Yearly screening is stopped once you have quit smoking for at least 15 years or develop a health problem that would prevent you from having lung cancer treatment.  Clinical breast exam.** / Every year after age 39 years.  BRCA-related cancer risk assessment.** / For women who have family members with a BRCA-related cancer (breast, ovarian, tubal, or peritoneal  cancers).  Mammogram.** / Every year beginning at age 33 years and continuing for as long as you are in good health. Consult with your health care provider.  Pap test.** / Every 3 years starting at age 55 years through age 47 or 29 years with a history of 3 consecutive normal Pap tests.  HPV screening.** / Every 3 years from ages 67 years through ages 53 to 48 years with a history of 3 consecutive normal Pap tests.  Fecal occult blood test (FOBT) of stool. / Every year beginning at age 25 years and continuing until age 25 years. You may not need to do this test if you get a colonoscopy every 10 years.  Flexible sigmoidoscopy or colonoscopy.** / Every 5 years for a flexible sigmoidoscopy or every 10 years for a colonoscopy beginning at age 82 years and continuing until age 81 years.  Hepatitis C blood test.** / For all people born from [birth year] through 1964 and any individual with known risks for hepatitis C.  Skin self-exam. / Monthly.  Influenza vaccine. / Every year.  Tetanus, diphtheria, and acellular pertussis (Tdap/Td) vaccine.** / Consult your health care provider. Pregnant women should receive 1 dose of Tdap vaccine during each pregnancy. 1 dose of Td every 10 years.  Varicella vaccine.** / Consult your health care provider. Pregnant females who do not have evidence of immunity should receive the first dose after pregnancy.  Zoster vaccine.** / 1 dose for adults aged 34 years or older.  Measles, mumps, rubella (MMR) vaccine.** / You need at least 1 dose of MMR if you were born in 1957 or later.  You may also need a second dose. For females of childbearing age, rubella immunity should be determined. If there is no evidence of immunity, females who are not pregnant should be vaccinated. If there is no evidence of immunity, females who are pregnant should delay immunization until after pregnancy.  Pneumococcal 13-valent conjugate (PCV13) vaccine.** / Consult your health care  provider.  Pneumococcal polysaccharide (PPSV23) vaccine.** / 1 to 2 doses if you smoke cigarettes or if you have certain conditions.  Meningococcal vaccine.** / Consult your health care provider.  Hepatitis A vaccine.** / Consult your health care provider.  Hepatitis B vaccine.** / Consult your health care provider.  Haemophilus influenzae type b (Hib) vaccine.** / Consult your health care provider. Ages 46 years and over  Blood pressure check.** / Every year.  Lipid and cholesterol check.** / Every 5 years beginning at age 27 years.  Lung cancer screening. / Every year if you are aged 30-80 years and have a 30-pack-year history of smoking and currently smoke or have quit within the past 15 years. Yearly screening is stopped once you have quit smoking for at least 15 years or develop a health problem that would prevent you from having lung cancer treatment.  Clinical breast exam.** / Every year after age 63 years.  BRCA-related cancer risk assessment.** / For women who have family members with a BRCA-related cancer (breast, ovarian, tubal, or peritoneal cancers).  Mammogram.** / Every year beginning at age 46 years and continuing for as long as you are in good health. Consult with your health care provider.  Pap test.** / Every 3 years starting at age 46 years through age 43 or 58 years with 3 consecutive normal Pap tests. Testing can be stopped between 65 and 70 years with 3 consecutive normal Pap tests and no abnormal Pap or HPV tests in the past 10 years.  HPV screening.** / Every 3 years from ages 64 years through ages 92 or 22 years with a history of 3 consecutive normal Pap tests. Testing can be stopped between 65 and 70 years with 3 consecutive normal Pap tests and no abnormal Pap or HPV tests in the past 10 years.  Fecal occult blood test (FOBT) of stool. / Every year beginning at age 67 years and continuing until age 86 years. You may not need to do this test if you get a  colonoscopy every 10 years.  Flexible sigmoidoscopy or colonoscopy.** / Every 5 years for a flexible sigmoidoscopy or every 10 years for a colonoscopy beginning at age 29 years and continuing until age 62 years.  Hepatitis C blood test.** / For all people born from [birth year] through 1964 and any individual with known risks for hepatitis C.  Osteoporosis screening.** / A one-time screening for women ages 17 years and over and women at risk for fractures or osteoporosis.  Skin self-exam. / Monthly.  Influenza vaccine. / Every year.  Tetanus, diphtheria, and acellular pertussis (Tdap/Td) vaccine.** / 1 dose of Td every 10 years.  Varicella vaccine.** / Consult your health care provider.  Zoster vaccine.** / 1 dose for adults aged 44 years or older.  Pneumococcal 13-valent conjugate (PCV13) vaccine.** / Consult your health care provider.  Pneumococcal polysaccharide (PPSV23) vaccine.** / 1 dose for all adults aged 43 years and older.  Meningococcal vaccine.** / Consult your health care provider.  Hepatitis A vaccine.** / Consult your health care provider.  Hepatitis B vaccine.** / Consult your health care provider.  Haemophilus influenzae type b (Hib)  vaccine.** / Consult your health care provider. ** Family history and personal history of risk and conditions may change your health care provider's recommendations.   This information is not intended to replace advice given to you by your health care provider. Make sure you discuss any questions you have with your health care provider.   Document Released: 10/04/2001 Document Revised: 08/29/2014 Document Reviewed: 01/03/2011 Elsevier Interactive Patient Education [employer].

## 2015-08-27 NOTE — Telephone Encounter (Signed)
Caller name: [name]   Relationship to patient: Self  Can be reached: [phone]  Reason for call: pt is returning your call.

## 2015-08-27 NOTE — Assessment & Plan Note (Signed)
ditropan is helping will continue

## 2015-08-27 NOTE — Telephone Encounter (Signed)
Called the patient to ask her if she has a current GI MD [facility].  She stated she does not and will [facility]

## 2015-08-28 LAB — URINE CYTOLOGY ANCILLARY ONLY
CHLAMYDIA, DNA PROBE: NEGATIVE
Neisseria Gonorrhea: NEGATIVE
Trichomonas: NEGATIVE

## 2015-09-01 LAB — URINE CYTOLOGY ANCILLARY ONLY
Bacterial vaginitis: NEGATIVE
Candida vaginitis: NEGATIVE

## 2015-09-06 ENCOUNTER — Encounter: Payer: Self-pay | Admitting: Family Medicine

## 2015-09-06 DIAGNOSIS — E782 Mixed hyperlipidemia: Secondary | ICD-10-CM

## 2015-09-06 DIAGNOSIS — Z0000 Encounter for general adult medical examination without abnormal findings: Secondary | ICD-10-CM

## 2015-09-06 HISTORY — DX: Encounter for general adult medical examination without abnormal findings: Z00.00

## 2015-09-06 HISTORY — DX: Mixed hyperlipidemia: E78.2

## 2015-09-06 NOTE — Assessment & Plan Note (Signed)
Responds to Clobetasol, may continue same

## 2015-09-06 NOTE — Assessment & Plan Note (Signed)
Patient encouraged to maintain heart healthy diet, regular exercise, adequate sleep. Consider daily probiotics. Take medications as prescribed. Referred for colonoscopy. Pap was 2015 normal. MGM ordered. Labs reviewed

## 2015-09-06 NOTE — Assessment & Plan Note (Signed)
Well controlled, no changes to meds. Encouraged heart healthy diet such as the DASH diet and exercise as tolerated.  °
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
## 2015-09-06 NOTE — Assessment & Plan Note (Signed)
Encouraged heart healthy diet, increase exercise, avoid trans fats, consider a krill oil cap daily 
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
## 2015-09-12 ENCOUNTER — Ambulatory Visit (HOSPITAL_BASED_OUTPATIENT_CLINIC_OR_DEPARTMENT_OTHER): Payer: Managed Care, Other (non HMO)

## 2015-09-21 ENCOUNTER — Ambulatory Visit: Payer: Managed Care, Other (non HMO) | Admitting: Family Medicine

## 2015-09-22 ENCOUNTER — Telehealth: Payer: Self-pay | Admitting: Family Medicine

## 2015-09-22 ENCOUNTER — Ambulatory Visit: Payer: Managed Care, Other (non HMO) | Admitting: Family Medicine

## 2015-09-22 NOTE — Telephone Encounter (Signed)
Patient apologized for missing her 10am appointment for today, patient states she took some medication and it "knocked her out" patient [facility] to 08/28/15. Charge or no charge

## 2015-09-22 NOTE — Telephone Encounter (Signed)
No charge. 

## 2015-09-26 ENCOUNTER — Other Ambulatory Visit: Payer: Self-pay | Admitting: Family Medicine

## 2015-09-28 ENCOUNTER — Encounter: Payer: Self-pay | Admitting: Family Medicine

## 2015-09-28 ENCOUNTER — Ambulatory Visit (INDEPENDENT_AMBULATORY_CARE_PROVIDER_SITE_OTHER): Payer: Managed Care, Other (non HMO) | Admitting: Family Medicine

## 2015-09-28 VITALS — BP 132/82 | HR 74 | Temp 98.0°F | Ht 63.0 in | Wt 149.2 lb

## 2015-09-28 DIAGNOSIS — E782 Mixed hyperlipidemia: Secondary | ICD-10-CM | POA: Diagnosis not present

## 2015-09-28 DIAGNOSIS — N3281 Overactive bladder: Secondary | ICD-10-CM | POA: Diagnosis not present

## 2015-09-28 DIAGNOSIS — I10 Essential (primary) hypertension: Secondary | ICD-10-CM | POA: Diagnosis not present

## 2015-09-28 DIAGNOSIS — M542 Cervicalgia: Secondary | ICD-10-CM | POA: Diagnosis not present

## 2015-09-28 MED ORDER — GABAPENTIN 100 MG PO CAPS: 100.0000 mg | ORAL_CAPSULE | Freq: Three times a day (TID) | ORAL | Status: DC | PRN

## 2015-09-28 MED ORDER — HYDROCODONE-ACETAMINOPHEN 10-325 MG PO TABS: 1.0000 | ORAL_TABLET | Freq: Three times a day (TID) | ORAL | Status: DC | PRN

## 2015-09-28 MED ORDER — GABAPENTIN 100 MG PO CAPS: 100.0000 mg | ORAL_CAPSULE | Freq: Three times a day (TID) | ORAL | Status: DC

## 2015-09-28 NOTE — Patient Instructions (Addendum)
Krill oil caps daily, NOW company, [employer], [email or web address] Minimize simple carbs and partially trans fats.   Neuropathic Pain Neuropathic pain is pain caused by damage to the nerves that are responsible for certain sensations in your body (sensory nerves). The pain can be caused by damage to:   The sensory nerves that send signals to your spinal cord and brain (peripheral nervous system).  The sensory nerves in your brain or spinal cord (central nervous system). Neuropathic pain can make you more sensitive to pain. What would be a minor sensation for most people may feel very painful if you have neuropathic pain. This is usually a long-term condition that can be difficult to treat. The type of pain can differ from person to person. It may start suddenly (acute), or it may develop slowly and last for a long time (chronic). Neuropathic pain may come and go as damaged nerves heal or may stay at the same level for years. It often causes emotional distress, loss of sleep, and a lower quality of life. CAUSES  The most common cause of damage to a sensory nerve is diabetes. Many other diseases and conditions can also cause neuropathic pain. Causes of neuropathic pain can be classified as:  Toxic. Many drugs and chemicals can cause toxic damage. The most common cause of toxic neuropathic pain is damage from drug treatment for cancer (chemotherapy).  Metabolic. This type of pain can happen when a disease causes imbalances that damage nerves. Diabetes is the most common of these diseases. Vitamin B deficiency caused by long-term alcohol abuse is another common cause.  Traumatic. Any injury that cuts, crushes, or stretches a nerve can cause damage and pain. A common example is feeling pain after losing an arm or leg (phantom limb pain).  Compression-related. If a sensory nerve gets trapped or compressed for a long period of time, the blood supply to the nerve can be cut off.  Vascular. Many blood  vessel diseases can cause neuropathic pain by decreasing blood supply and oxygen to nerves.  Autoimmune. This type of pain results from diseases in which the body's defense system mistakenly attacks sensory nerves. Examples of autoimmune diseases that can cause neuropathic pain include lupus and multiple sclerosis.  Infectious. Many types of viral infections can damage sensory nerves and cause pain. Shingles infection is a common cause of this type of pain.  Inherited. Neuropathic pain can be a symptom of many diseases that are passed down through families (genetic). SIGNS AND SYMPTOMS  The main symptom is pain. Neuropathic pain is often described as:  Burning.  Shock-like.  Stinging.  Hot or cold.  Itching. DIAGNOSIS  No single test can diagnose neuropathic pain. Your health care provider will do a physical exam and ask you about your pain. You may use a pain scale to describe how bad your pain is. You may also have tests to see if you have a high sensitivity to pain and to help find the cause and location of any sensory nerve damage. These tests may include:  Imaging studies, such as:  X-rays.  CT scan.  MRI.  Nerve conduction studies to test how well nerve signals travel through your sensory nerves (electrodiagnostic testing).  Stimulating your sensory nerves through electrodes on your skin and measuring the response in your spinal cord and brain (somatosensory evoked potentials). TREATMENT  Treatment for neuropathic pain may change over time. You may need to try different treatment options or a combination of treatments. Some options include:  Over-the-counter  pain relievers.  Prescription medicines. Some medicines used to treat other conditions may also help neuropathic pain. These include medicines to:  Control seizures (anticonvulsants).  Relieve depression (antidepressants).  Prescription-strength pain relievers (narcotics). These are usually used when other pain  relievers do not help.  Transcutaneous nerve stimulation (TENS). This uses electrical currents to block painful nerve signals. The treatment is painless.  Topical and local anesthetics. These are medicines that numb the nerves. They can be injected as a nerve block or applied to the skin.  Alternative treatments, such as:  Acupuncture.  Meditation.  Massage.  Physical therapy.  Pain management programs.  Counseling. HOME CARE INSTRUCTIONS  Learn as much as you can about your condition.  Take medicines only as directed by your health care provider.  Work closely with all your health care providers to find what works best for you.  Have a good support system at home.  Consider joining a chronic pain support group. SEEK MEDICAL CARE IF:  Your pain treatments are not helping.  You are having side effects from your medicines.  You are struggling with fatigue, mood changes, depression, or anxiety.   This information is not intended to replace advice given to you by your health care provider. Make sure you discuss any questions you have with your health care provider.   Document Released: 05/05/2004 Document Revised: 08/29/2014 Document Reviewed: 01/16/2014 Elsevier Interactive Patient Education [employer]

## 2015-09-28 NOTE — Progress Notes (Signed)
Pre visit review using our clinic review tool, if applicable. No additional management support is needed unless otherwise documented below in the visit note. 
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
## 2015-10-04 ENCOUNTER — Encounter: Payer: Self-pay | Admitting: Family Medicine

## 2015-10-04 NOTE — Assessment & Plan Note (Signed)
Well controlled, no changes to meds. Encouraged heart healthy diet such as the DASH diet and exercise as tolerated.  °

## 2015-10-04 NOTE — Assessment & Plan Note (Addendum)
Has been diagnosed with brachial neuritis and is follow ing with [facility], continue current meds, add Gabapentin

## 2015-10-04 NOTE — Progress Notes (Signed)
Patient ID: Suzanne Barber, female   DOB: Aug 31, 1955, 60 y.o.   MRN: [id number]   Subjective:    Patient ID: Suzanne Barber, female    DOB: 1956/06/28, 60 y.o.   MRN: [id number]  Chief Complaint  Patient presents with  . Follow-up    HPI Patient is in today for follow-up. She continues to struggle with chronic pain most notably in her neck with radicular symptoms down her left arm. Has pain into the left wrist and has been following with [facility] orth[facility]. She has been diagnosed with brachial neuritis. She continues to work night shift and as a result struggles with fatigue. No other recent illness. Denies CP/palp/SOB/HA/congestion/fevers/GI or GU c/o. Taking meds as prescribed  Past Medical History  Diagnosis Date  . ADD (attention deficit disorder)   . Frequent headaches   . Urine incontinence   . Depression   . Hypertension   . Depression with anxiety   . OAB (overactive bladder)   . Hyperlipidemia, mixed 09/06/2015  . Preventative health care 09/06/2015    Past Surgical History  Procedure Laterality Date  . Breast biopsy  2006    Family History  Problem Relation Age of Onset  . Diabetes Mother   . Colon cancer Mother   . Stroke Mother   . Hypertension Mother   . Colon cancer Sister   . Cancer Sister 64    colon  . Diabetes Sister   . Breast cancer Sister   . Cancer Sister     breast  . Breast cancer Maternal Grandmother   . Alcohol abuse Father     cirrhosis  . Diverticulitis Father   . Cancer Sister     cervix  . Heart disease Brother 38  . Mental illness Brother     schizophrenia    Social History   Social History  . Marital Status: Married    Spouse Name: N/A  . Number of Children: N/A  . Years of Education: N/A   Occupational History  . Not on file.   Social History Main Topics  . Smoking status: Former Smoker    Quit date: 05/14/1983  . Smokeless tobacco: Not on file  . Alcohol Use: Yes  . Drug Use: No  . Sexual Activity: Yes    Comment: married, husband in [place], no major dietary restrictions. eats hi protein minimizes sugar   Other Topics Concern  . Not on file   Social History Narrative    Outpatient Prescriptions Prior to Visit  Medication Sig Dispense Refill  . amphetamine-dextroamphetamine (ADDERALL) 5 MG tablet Take 1 tablet (5 mg total) by mouth 2 (two) times daily with a meal. october 2016 60 tablet 0  . aspirin 81 MG EC tablet Take 81 mg by mouth daily.    . Cholecalciferol (VITAMIN D) 2000 UNITS CAPS Take by mouth.    . citalopram (CELEXA) 40 MG tablet TAKE 1 TABLET BY MOUTH EVERY DAY 30 tablet 3  . clobetasol ointment (TEMOVATE) 0.05 % Apply 1 application topically 2 (two) times daily. 30 g 0  . cyclobenzaprine (FLEXERIL) 10 MG tablet Take 1 tablet (10 mg total) by mouth 2 (two) times daily as needed for muscle spasms. 40 tablet 1  . hydrochlorothiazide (MICROZIDE) 12.5 MG capsule Take 1 capsule (12.5 mg total) by mouth as needed. 90 capsule 1  . hydrOXYzine (ATARAX/VISTARIL) 25 MG tablet Take 1 tablet (25 mg total) by mouth every 8 (eight) hours as needed for itching. 21 tablet 0  .  ibuprofen (ADVIL,MOTRIN) 600 MG tablet Take 1 tablet (600 mg total) by mouth every 6 (six) hours as needed. 90 tablet 2  . Multiple Vitamin (MULTIVITAMIN) tablet Take 1 tablet by mouth daily.    . mupirocin cream (BACTROBAN) 2 % Apply 1 application topically 2 (two) times daily. 15 g 0  . Omega-3 Fatty Acids (OMEGA 3 PO) Take by mouth 2 (two) times daily.    Marland Kitchen oxybutynin (DITROPAN) 5 MG tablet Take 1 tablet (5 mg total) by mouth daily as needed. 90 tablet 1  . HYDROcodone-acetaminophen (NORCO) 10-325 MG tablet Take 1 tablet by mouth every 8 (eight) hours as needed. 60 tablet 0  . cetirizine (ZYRTEC) 10 MG tablet Take 10 mg by mouth daily.     No facility-administered medications prior to visit.    Allergies  Allergen Reactions  . Shellfish Allergy Rash  . Penicillins Itching and Swelling  . Latex Rash    Review  of Systems  Constitutional: Positive for malaise/fatigue. Negative for fever.  HENT: Negative for congestion.   Eyes: Negative for discharge.  Respiratory: Negative for shortness of breath.   Cardiovascular: Negative for chest pain, palpitations and leg swelling.  Gastrointestinal: Negative for nausea and abdominal pain.  Genitourinary: Negative for dysuria.  Musculoskeletal: Positive for neck pain. Negative for falls.  Skin: Negative for rash.  Neurological: Positive for headaches. Negative for loss of consciousness.  Endo/Heme/Allergies: Negative for environmental allergies.  Psychiatric/Behavioral: Positive for depression. The patient is not nervous/anxious.        Objective:    Physical Exam  Constitutional: She is oriented to person, place, and time. She appears well-developed and well-nourished. No distress.  HENT:  Head: Normocephalic and atraumatic.  Nose: Nose normal.  Eyes: Right eye exhibits no discharge. Left eye exhibits no discharge.  Neck: Normal range of motion. Neck supple.  Cardiovascular: Normal rate and regular rhythm.   No murmur heard. Pulmonary/Chest: Effort normal and breath sounds normal.  Abdominal: Soft. Bowel sounds are normal. There is no tenderness.  Musculoskeletal: She exhibits no edema.  Neurological: She is alert and oriented to person, place, and time.  Skin: Skin is warm and dry.  Psychiatric: She has a normal mood and affect.  Nursing note and vitals reviewed.   BP 132/82 mmHg  Pulse 74  Temp(Src) 98 F (36.7 C) (Oral)  Ht  (1.6 m)  Wt 149 lb 4 oz (67.699 kg)  BMI 26.44 kg/m2  SpO2 96% Wt Readings from Last 3 Encounters:  09/28/15 149 lb 4 oz (67.699 kg)  08/27/15 147 lb (66.679 kg)  08/06/15 142 lb 6.4 oz (64.592 kg)     Lab Results  Component Value Date   WBC 5.9 08/27/2015   HGB 13.3 08/27/2015   HCT 42.0 08/27/2015   PLT 276.0 08/27/2015   GLUCOSE 78 08/27/2015   CHOL 248* 08/27/2015   TRIG 51.0 08/27/2015   HDL  73.50 08/27/2015   LDLCALC 164* 08/27/2015   ALT 10 08/27/2015   AST 16 08/27/2015   NA 141 08/27/2015   K 4.1 08/27/2015   CL 105 08/27/2015   CREATININE 0.84 08/27/2015   BUN 12 08/27/2015   CO2 31 08/27/2015   TSH 0.62 08/27/2015    Lab Results  Component Value Date   TSH 0.62 08/27/2015   Lab Results  Component Value Date   WBC 5.9 08/27/2015   HGB 13.3 08/27/2015   HCT 42.0 08/27/2015   MCV 85.4 08/27/2015   PLT 276.0 08/27/2015  Lab Results  Component Value Date   NA 141 08/27/2015   K 4.1 08/27/2015   CO2 31 08/27/2015   GLUCOSE 78 08/27/2015   BUN 12 08/27/2015   CREATININE 0.84 08/27/2015   BILITOT 0.3 08/27/2015   ALKPHOS 70 08/27/2015   AST 16 08/27/2015   ALT 10 08/27/2015   PROT 7.6 08/27/2015   ALBUMIN 4.2 08/27/2015   CALCIUM 9.7 08/27/2015   GFR 89.43 08/27/2015   Lab Results  Component Value Date   CHOL 248* 08/27/2015   Lab Results  Component Value Date   HDL 73.50 08/27/2015   Lab Results  Component Value Date   LDLCALC 164* 08/27/2015   Lab Results  Component Value Date   TRIG 51.0 08/27/2015   Lab Results  Component Value Date   CHOLHDL 3 08/27/2015   No results found for: HGBA1C     Assessment & Plan:   Problem List Items Addressed This Visit    Hyperlipidemia, mixed    Encouraged heart healthy diet, increase exercise, avoid trans fats, consider a krill oil cap daily      Hypertension    Well controlled, no changes to meds. Encouraged heart healthy diet such as the DASH diet and exercise as tolerated.       Neck pain - Primary    Has been diagnosed with brachial neuritis and is follow ing with [facility], continue current meds, add Gabapentin      Relevant Medications   HYDROcodone-acetaminophen (NORCO) 10-325 MG tablet   OAB (overactive bladder)    Feels oxybutynin has been helpful         I am having Ms. Boike start on gabapentin and gabapentin. I am also having her maintain her aspirin, Vitamin D,  cyclobenzaprine, hydrochlorothiazide, multivitamin, Omega-3 Fatty Acids (OMEGA 3 PO), oxybutynin, cetirizine, clobetasol ointment, hydrOXYzine, amphetamine-dextroamphetamine, mupirocin cream, ibuprofen, citalopram, and HYDROcodone-acetaminophen.  Meds ordered this encounter  Medications  . gabapentin (NEURONTIN) 100 MG capsule    Sig: Take 1 capsule (100 mg total) by mouth 3 (three) times daily.    Dispense:  90 capsule    Refill:  3  . gabapentin (NEURONTIN) 100 MG capsule    Sig: Take 1 capsule (100 mg total) by mouth 3 (three) times daily as needed.    Dispense:  90 capsule    Refill:  3  . HYDROcodone-acetaminophen (NORCO) 10-325 MG tablet    Sig: Take 1 tablet by mouth every 8 (eight) hours as needed.    Dispense:  60 tablet    Refill:  0     Danise Edge, MD

## 2015-10-04 NOTE — Assessment & Plan Note (Signed)
Feels oxybutynin has been helpful

## 2015-10-04 NOTE — Assessment & Plan Note (Signed)
Encouraged heart healthy diet, increase exercise, avoid trans fats, consider a krill oil cap daily 

## 2015-11-12 ENCOUNTER — Other Ambulatory Visit: Payer: Self-pay | Admitting: Family Medicine

## 2015-11-12 DIAGNOSIS — M542 Cervicalgia: Secondary | ICD-10-CM

## 2015-11-12 MED ORDER — HYDROCODONE-ACETAMINOPHEN 10-325 MG PO TABS: 1.0000 | ORAL_TABLET | Freq: Three times a day (TID) | ORAL | Status: DC | PRN

## 2015-11-12 NOTE — Telephone Encounter (Signed)
Requesting:  Hydrocodone Contract  None UDS   None Last OV   09/28/2015 Last Refill    #60 with 0 refills on 09/28/2015  Please Advise

## 2015-11-12 NOTE — Telephone Encounter (Signed)
Caller name: Self  Can be reached: [phone]    Reason for call: Refill HYDROcodone-acetaminophen (NORCO) 10-325 MG tablet [[id number]][[id number]]

## 2015-11-13 NOTE — Telephone Encounter (Signed)
Patient notified to pickup hardcopy at the front desk.(detailed message left on number below)

## 2015-12-08 ENCOUNTER — Telehealth: Payer: Self-pay

## 2015-12-08 ENCOUNTER — Encounter: Payer: Self-pay | Admitting: Internal Medicine

## 2015-12-08 ENCOUNTER — Ambulatory Visit (INDEPENDENT_AMBULATORY_CARE_PROVIDER_SITE_OTHER): Payer: Managed Care, Other (non HMO) | Admitting: Internal Medicine

## 2015-12-08 VITALS — BP 132/68 | HR 85 | Temp 98.2°F | Ht 63.0 in | Wt 140.0 lb

## 2015-12-08 DIAGNOSIS — I10 Essential (primary) hypertension: Secondary | ICD-10-CM | POA: Diagnosis not present

## 2015-12-08 DIAGNOSIS — R202 Paresthesia of skin: Secondary | ICD-10-CM

## 2015-12-08 DIAGNOSIS — M542 Cervicalgia: Secondary | ICD-10-CM

## 2015-12-08 MED ORDER — MUPIROCIN CALCIUM 2 % EX CREA: 1.0000 "application " | TOPICAL_CREAM | Freq: Two times a day (BID) | CUTANEOUS | Status: DC

## 2015-12-08 MED ORDER — CYCLOBENZAPRINE HCL 10 MG PO TABS: 10.0000 mg | ORAL_TABLET | Freq: Two times a day (BID) | ORAL | Status: DC | PRN

## 2015-12-08 MED ORDER — CLOBETASOL PROPIONATE 0.05 % EX OINT: 1.0000 "application " | TOPICAL_OINTMENT | Freq: Two times a day (BID) | CUTANEOUS | Status: DC

## 2015-12-08 MED ORDER — HYDROCODONE-ACETAMINOPHEN 10-325 MG PO TABS: 1.0000 | ORAL_TABLET | Freq: Three times a day (TID) | ORAL | Status: DC | PRN

## 2015-12-08 MED ORDER — PREGABALIN 75 MG PO CAPS: 75.0000 mg | ORAL_CAPSULE | Freq: Two times a day (BID) | ORAL | Status: DC

## 2015-12-08 NOTE — Patient Instructions (Signed)
Get an appointment to see your primary doctor 4 weeks from today  start lyrica 75 mg: the first week take only one tablet every night, then one tablet twice a day  continue hydrocodone as prescribed.   Check the  blood pressure 2 or 3 times a  week Be sure your blood pressure is between 110/65 and  145/85.  if it is consistently higher or lower, call the office
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
## 2015-12-08 NOTE — Telephone Encounter (Signed)
Pt seen today for acute visit w/ Dr. Drue NovelPaz for BP concerns. She is also requesting refills on Clobetasol ointment 0.05 %, Mupirocin cream 2 %, and Flexeril 10mg . Informed her I would send message to Dr. Abner GreenspanBlyth and assistant.

## 2015-12-08 NOTE — Progress Notes (Signed)
Pre visit review using our clinic review tool, if applicable. No additional management support is needed unless otherwise documented below in the visit note. 

## 2015-12-08 NOTE — Telephone Encounter (Signed)
Refills done.

## 2016-01-12 ENCOUNTER — Ambulatory Visit (INDEPENDENT_AMBULATORY_CARE_PROVIDER_SITE_OTHER): Payer: Managed Care, Other (non HMO) | Admitting: Family Medicine

## 2016-01-12 ENCOUNTER — Encounter: Payer: Self-pay | Admitting: Family Medicine

## 2016-01-12 VITALS — BP 112/72 | HR 56 | Temp 97.7°F | Ht 63.0 in | Wt 150.1 lb

## 2016-01-12 DIAGNOSIS — M542 Cervicalgia: Secondary | ICD-10-CM | POA: Diagnosis not present

## 2016-01-12 DIAGNOSIS — R202 Paresthesia of skin: Secondary | ICD-10-CM

## 2016-01-12 DIAGNOSIS — E782 Mixed hyperlipidemia: Secondary | ICD-10-CM

## 2016-01-12 DIAGNOSIS — I10 Essential (primary) hypertension: Secondary | ICD-10-CM

## 2016-01-12 DIAGNOSIS — N3281 Overactive bladder: Secondary | ICD-10-CM

## 2016-01-12 MED ORDER — HYDROCODONE-ACETAMINOPHEN 10-325 MG PO TABS: 1.0000 | ORAL_TABLET | Freq: Three times a day (TID) | ORAL | Status: DC | PRN

## 2016-01-12 MED ORDER — PREGABALIN 75 MG PO CAPS: 75.0000 mg | ORAL_CAPSULE | Freq: Three times a day (TID) | ORAL | Status: DC

## 2016-01-12 MED ORDER — ALPRAZOLAM 0.25 MG PO TABS: 0.2500 mg | ORAL_TABLET | Freq: Two times a day (BID) | ORAL | Status: DC | PRN

## 2016-01-12 NOTE — Patient Instructions (Addendum)
NOW probiotics 10 strain cap dialy at [facility]  Peripheral Neuropathy Peripheral neuropathy is a type of nerve damage. It affects nerves that carry signals between the spinal cord and other parts of the body. These are called peripheral nerves. With peripheral neuropathy, one nerve or a group of nerves may be damaged.  CAUSES  Many things can damage peripheral nerves. For some people with peripheral neuropathy, the cause is unknown. Some causes include:  Diabetes. This is the most common cause of peripheral neuropathy.  Injury to a nerve.  Pressure or stress on a nerve that lasts a long time.  Too little vitamin B. Alcoholism can lead to this.  Infections.  Autoimmune diseases, such as multiple sclerosis and systemic lupus erythematosus.  Inherited nerve diseases.  Some medicines, such as cancer drugs.  Toxic substances, such as lead and mercury.  Too little blood flowing to the legs.  Kidney disease.  Thyroid disease. SIGNS AND SYMPTOMS  Different people have different symptoms. The symptoms you have will depend on which of your nerves is damaged. Common symptoms include:  Loss of feeling (numbness) in the feet and hands.  Tingling in the feet and hands.  Pain that burns.  Very sensitive skin.  Weakness.  Not being able to move a part of the body (paralysis).  Muscle twitching.  Clumsiness or poor coordination.  Loss of balance.  Not being able to control your bladder.  Feeling dizzy.  Sexual problems. DIAGNOSIS  Peripheral neuropathy is a symptom, not a disease. Finding the cause of peripheral neuropathy can be hard. To figure that out, your health care provider will take a medical history and do a physical exam. A neurological exam will also be done. This involves checking things affected by your brain, spinal cord, and nerves (nervous system). For example, your health care provider will check your reflexes, how you move, and what you can feel.   Other types of tests may also be ordered, such as:  Blood tests.  A test of the fluid in your spinal cord.  Imaging tests, such as CT scans or an MRI.  Electromyography (EMG). This test checks the nerves that control muscles.  Nerve conduction velocity tests. These tests check how fast messages pass through your nerves.  Nerve biopsy. A small piece of nerve is removed. It is then checked under a microscope. TREATMENT   Medicine is often used to treat peripheral neuropathy. Medicines may include:  Pain-relieving medicines. Prescription or over-the-counter medicine may be suggested.  Antiseizure medicine. This may be used for pain.  Antidepressants. These also may help ease pain from neuropathy.  Lidocaine. This is a numbing medicine. You might wear a patch or be given a shot.  Mexiletine. This medicine is typically used to help control irregular heart rhythms.  Surgery. Surgery may be needed to relieve pressure on a nerve or to destroy a nerve that is causing pain.  Physical therapy to help movement.  Assistive devices to help movement. HOME CARE INSTRUCTIONS   Only take over-the-counter or prescription medicines as directed by your health care provider. Follow the instructions carefully for any given medicines. Do not take any other medicines without first getting approval from your health care provider.  If you have diabetes, work closely with your health care provider to keep your blood sugar under control.  If you have numbness in your feet:  Check every day for signs of injury or infection. Watch for redness, warmth, and swelling.  Wear padded socks and  comfortable shoes. These help protect your feet.  Do not do things that put pressure on your damaged nerve.  Do not smoke. Smoking keeps blood from getting to damaged nerves.  Avoid or limit alcohol. Too much alcohol can cause a lack of B vitamins. These vitamins are needed for healthy nerves.  Develop a good  support system. Coping with peripheral neuropathy can be stressful. Talk to a mental health specialist or join a support group if you are struggling.  Follow up with your health care provider as directed. SEEK MEDICAL CARE IF:   You have new signs or symptoms of peripheral neuropathy.  You are struggling emotionally from dealing with peripheral neuropathy.  You have a fever. SEEK IMMEDIATE MEDICAL CARE IF:   You have an injury or infection that is not healing.  You feel very dizzy or begin vomiting.  You have chest pain.  You have trouble breathing.   This information is not intended to replace advice given to you by your health care provider. Make sure you discuss any questions you have with your health care provider.   Document Released: 07/29/2002 Document Revised: 04/20/2011 Document Reviewed: 04/15/2013 Elsevier Interactive Patient Education [employer]2016 [employer]vier Inc.

## 2016-01-12 NOTE — Progress Notes (Signed)
Pre visit review using our clinic review tool, if applicable. No additional management support is needed unless otherwise documented below in the visit note. 

## 2016-01-18 NOTE — Assessment & Plan Note (Signed)
Encouraged heart healthy diet, increase exercise, avoid trans fats, consider a krill oil cap daily 

## 2016-01-18 NOTE — Assessment & Plan Note (Signed)
Well controlled, no changes to meds. Encouraged heart healthy diet such as the DASH diet and exercise as tolerated.  °

## 2016-01-18 NOTE — Assessment & Plan Note (Signed)
Patient has stopped Ditropan and she is doing well, will continue off of med for now

## 2016-01-18 NOTE — Assessment & Plan Note (Signed)
Lyrica bid has been helpful but still with some discomfort. will increase to tid

## 2016-01-18 NOTE — Assessment & Plan Note (Signed)
Encouraged moist heat and gentle stretching as tolerated. May try NSAIDs and prescription meds as directed and report if symptoms worsen or seek immediate care. Try topical patches 

## 2016-01-18 NOTE — Progress Notes (Signed)
Patient ID: Suzanne Barber, female   DOB: [birth year], 60 y.o.   MRN: [id number]   Subjective:    Patient ID: Suzanne Barber, female    DOB: 1956/06/05, 60 y.o.   MRN: [id number]  Chief Complaint  Patient presents with  . Follow-up    HPI Patient is in today for follow up. She is doing well today. She stopped the Ditropan and has had no flare in her OAB symptoms. Her vaginitis has responded to conservative measures and topical treatments. Denies CP/palp/SOB/HA/congestion/fevers/GI or GU c/o. Taking meds as prescribed. Has had some trouble with neck pain intermittently but no injury or fall. Her Lyrica is tolerated at bid and she gets some but not complete relief.   Past Medical History  Diagnosis Date  . ADD (attention deficit disorder)   . Frequent headaches   . Urine incontinence   . Depression   . Hypertension   . Depression with anxiety   . OAB (overactive bladder)   . Hyperlipidemia, mixed 09/06/2015  . Preventative health care 09/06/2015    Past Surgical History  Procedure Laterality Date  . Breast biopsy  2006    Family History  Problem Relation Age of Onset  . Diabetes Mother   . Colon cancer Mother   . Stroke Mother   . Hypertension Mother   . Colon cancer Sister   . Cancer Sister 40    colon  . Diabetes Sister   . Breast cancer Sister   . Cancer Sister     breast  . Breast cancer Maternal Grandmother   . Alcohol abuse Father     cirrhosis  . Diverticulitis Father   . Cancer Sister     cervix  . Heart disease Brother 23  . Mental illness Brother     schizophrenia    Social History   Social History  . Marital Status: Married    Spouse Name: N/A  . Number of Children: N/A  . Years of Education: N/A   Occupational History  . Not on file.   Social History Main Topics  . Smoking status: Former Smoker    Quit date: 05/14/1983  . Smokeless tobacco: Not on file  . Alcohol Use: Yes  . Drug Use: No  . Sexual Activity: Yes     Comment: married,  husband in [place], no major dietary restrictions. eats hi protein minimizes sugar   Other Topics Concern  . Not on file   Social History Narrative    Outpatient Prescriptions Prior to Visit  Medication Sig Dispense Refill  . amphetamine-dextroamphetamine (ADDERALL) 5 MG tablet Take 1 tablet (5 mg total) by mouth 2 (two) times daily with a meal. october 2016 60 tablet 0  . aspirin 81 MG EC tablet Take 81 mg by mouth daily.    . Cholecalciferol (VITAMIN D) 2000 UNITS CAPS Take by mouth.    . citalopram (CELEXA) 40 MG tablet TAKE 1 TABLET BY MOUTH EVERY DAY 30 tablet 3  . clobetasol ointment (TEMOVATE) 0.05 % Apply 1 application topically 2 (two) times daily. 30 g 0  . cyclobenzaprine (FLEXERIL) 10 MG tablet Take 1 tablet (10 mg total) by mouth 2 (two) times daily as needed for muscle spasms. 40 tablet 0  . hydrOXYzine (ATARAX/VISTARIL) 25 MG tablet Take 1 tablet (25 mg total) by mouth every 8 (eight) hours as needed for itching. 21 tablet 0  . ibuprofen (ADVIL,MOTRIN) 600 MG tablet Take 1 tablet (600 mg total) by mouth every 6 (six) hours  as needed. 90 tablet 2  . Multiple Vitamin (MULTIVITAMIN) tablet Take 1 tablet by mouth daily.    . mupirocin cream (BACTROBAN) 2 % Apply 1 application topically 2 (two) times daily. 15 g 0  . Omega-3 Fatty Acids (OMEGA 3 PO) Take by mouth 2 (two) times daily.    . hydrochlorothiazide (MICROZIDE) 12.5 MG capsule Take 1 capsule (12.5 mg total) by mouth as needed. 90 capsule 1  . HYDROcodone-acetaminophen (NORCO) 10-325 MG tablet Take 1 tablet by mouth every 8 (eight) hours as needed. 60 tablet 0  . oxybutynin (DITROPAN) 5 MG tablet Take 1 tablet (5 mg total) by mouth daily as needed. 90 tablet 1  . pregabalin (LYRICA) 75 MG capsule Take 1 capsule (75 mg total) by mouth 2 (two) times daily. 60 capsule 1  . cetirizine (ZYRTEC) 10 MG tablet Take 10 mg by mouth daily. Reported on 12/08/2015     No facility-administered medications prior to visit.    Allergies   Allergen Reactions  . Shellfish Allergy Rash  . Penicillins Itching and Swelling  . Latex Rash    Review of Systems  Constitutional: Negative for fever and malaise/fatigue.  HENT: Negative for congestion.   Eyes: Negative for blurred vision.  Respiratory: Negative for shortness of breath.   Cardiovascular: Negative for chest pain, palpitations and leg swelling.  Gastrointestinal: Negative for nausea, abdominal pain and blood in stool.  Genitourinary: Negative for dysuria and frequency.  Musculoskeletal: Positive for neck pain. Negative for falls.  Skin: Negative for rash.  Neurological: Negative for dizziness, loss of consciousness and headaches.  Endo/Heme/Allergies: Negative for environmental allergies.  Psychiatric/Behavioral: Negative for depression. The patient is not nervous/anxious.        Objective:    Physical Exam  Constitutional: She is oriented to person, place, and time. She appears well-developed and well-nourished. No distress.  HENT:  Head: Normocephalic and atraumatic.  Nose: Nose normal.  Eyes: Right eye exhibits no discharge. Left eye exhibits no discharge.  Neck: Normal range of motion. Neck supple.  Cardiovascular: Normal rate and regular rhythm.   No murmur heard. Pulmonary/Chest: Effort normal and breath sounds normal.  Abdominal: Soft. Bowel sounds are normal. There is no tenderness.  Musculoskeletal: She exhibits no edema.  Neurological: She is alert and oriented to person, place, and time.  Skin: Skin is warm and dry.  Psychiatric: She has a normal mood and affect.  Nursing note and vitals reviewed.   BP 112/72 mmHg  Pulse 56  Temp(Src) 97.7 F (36.5 C) (Oral)  Ht 5\' 3"  (1.6 m)  Wt 150 lb 2 oz (68.096 kg)  BMI 26.60 kg/m2  SpO2 97% Wt Readings from Last 3 Encounters:  01/12/16 150 lb 2 oz (68.096 kg)  12/08/15 140 lb (63.504 kg)  09/28/15 149 lb 4 oz (67.699 kg)     Lab Results  Component Value Date   WBC 5.9 08/27/2015   HGB  13.3 08/27/2015   HCT 42.0 08/27/2015   PLT 276.0 08/27/2015   GLUCOSE 78 08/27/2015   CHOL 248* 08/27/2015   TRIG 51.0 08/27/2015   HDL 73.50 08/27/2015   LDLCALC 164* 08/27/2015   ALT 10 08/27/2015   AST 16 08/27/2015   NA 141 08/27/2015   K 4.1 08/27/2015   CL 105 08/27/2015   CREATININE 0.84 08/27/2015   BUN 12 08/27/2015   CO2 31 08/27/2015   TSH 0.62 08/27/2015    Lab Results  Component Value Date   TSH 0.62 08/27/2015  Lab Results  Component Value Date   WBC 5.9 08/27/2015   HGB 13.3 08/27/2015   HCT 42.0 08/27/2015   MCV 85.4 08/27/2015   PLT 276.0 08/27/2015   Lab Results  Component Value Date   NA 141 08/27/2015   K 4.1 08/27/2015   CO2 31 08/27/2015   GLUCOSE 78 08/27/2015   BUN 12 08/27/2015   CREATININE 0.84 08/27/2015   BILITOT 0.3 08/27/2015   ALKPHOS 70 08/27/2015   AST 16 08/27/2015   ALT 10 08/27/2015   PROT 7.6 08/27/2015   ALBUMIN 4.2 08/27/2015   CALCIUM 9.7 08/27/2015   GFR 89.43 08/27/2015   Lab Results  Component Value Date   CHOL 248* 08/27/2015   Lab Results  Component Value Date   HDL 73.50 08/27/2015   Lab Results  Component Value Date   LDLCALC 164* 08/27/2015   Lab Results  Component Value Date   TRIG 51.0 08/27/2015   Lab Results  Component Value Date   CHOLHDL 3 08/27/2015   No results found for: HGBA1C     Assessment & Plan:   Problem List Items Addressed This Visit    Paresthesias    Lyrica bid has been helpful but still with some discomfort. will increase to tid      OAB (overactive bladder)    Patient has stopped Ditropan and she is doing well, will continue off of med for now      Neck pain - Primary    Encouraged moist heat and gentle stretching as tolerated. May try NSAIDs and prescription meds as directed and report if symptoms worsen or seek immediate care. Try topical patches      Relevant Medications   HYDROcodone-acetaminophen (NORCO) 10-325 MG tablet   Hypertension    Well  controlled, no changes to meds. Encouraged heart healthy diet such as the DASH diet and exercise as tolerated.       Hyperlipidemia, mixed    Encouraged heart healthy diet, increase exercise, avoid trans fats, consider a krill oil cap daily      Relevant Orders   Comprehensive metabolic panel   Lipid panel      I have discontinued Ms. Sahli's hydrochlorothiazide and oxybutynin. I have also changed her pregabalin. Additionally, I am having her start on ALPRAZolam. Lastly, I am having her maintain her aspirin, Vitamin D, multivitamin, Omega-3 Fatty Acids (OMEGA 3 PO), cetirizine, hydrOXYzine, amphetamine-dextroamphetamine, ibuprofen, citalopram, clobetasol ointment, mupirocin cream, cyclobenzaprine, and HYDROcodone-acetaminophen.  Meds ordered this encounter  Medications  . HYDROcodone-acetaminophen (NORCO) 10-325 MG tablet    Sig: Take 1 tablet by mouth every 8 (eight) hours as needed.    Dispense:  60 tablet    Refill:  0  . pregabalin (LYRICA) 75 MG capsule    Sig: Take 1 capsule (75 mg total) by mouth 3 (three) times daily.    Dispense:  90 capsule    Refill:  3  . ALPRAZolam (XANAX) 0.25 MG tablet    Sig: Take 1 tablet (0.25 mg total) by mouth 2 (two) times daily as needed for anxiety.    Dispense:  40 tablet    Refill:  2     Danise Edge, MD

## 2016-03-01 ENCOUNTER — Telehealth: Payer: Self-pay | Admitting: Family Medicine

## 2016-03-01 DIAGNOSIS — M542 Cervicalgia: Secondary | ICD-10-CM

## 2016-03-01 NOTE — Telephone Encounter (Signed)
Relation to JX:BJYNpt:self Call back [phone] Pharmacy:  Reason for call:  Patient requesting a refill HYDROcodone-acetaminophen (NORCO) 10-325 MG tablet and ALPRAZolam (XANAX) 0.25 MG tablet and would like to pick up both Rxs in office.

## 2016-03-01 NOTE — Telephone Encounter (Signed)
OK to refill Hydrocodone. The Alprazolam should not be due til late August. That had refills

## 2016-03-01 NOTE — Telephone Encounter (Signed)
Requesting:  Alprazolam and hydrocodone Contract   None UDS   None Last OV   01/12/2016 Last Refill  Alprazolam  #40 with 2 refills on 01/12/2016                   Hydrocodone  #60 with 0 refills on 01/12/2016  Please Advise

## 2016-03-03 MED ORDER — HYDROCODONE-ACETAMINOPHEN 10-325 MG PO TABS: 1.0000 | ORAL_TABLET | Freq: Three times a day (TID) | ORAL | Status: DC | PRN

## 2016-03-03 NOTE — Telephone Encounter (Signed)
Informed pt of the below. She will be in for pick up.

## 2016-03-03 NOTE — Telephone Encounter (Signed)
Printed hydrocodone and on counter for signature. Called the patient to inform of PCP instructions, left msg. To call back.

## 2016-03-03 NOTE — Telephone Encounter (Signed)
Called left msg. To call back. Hydrocodone is at the front ready for pickup.

## 2016-03-03 NOTE — Telephone Encounter (Signed)
Patient aware.

## 2016-03-04 IMAGING — DX DG LUMBAR SPINE COMPLETE 4+V
5 series · 5 of 5 positions shown · non-contrast
Comparison: None.

CLINICAL DATA: Chronic lower back pain with bilateral lower
extremity radiculopathy.

EXAM:
LUMBAR SPINE - COMPLETE 4+ VIEW

[l-spine ap]
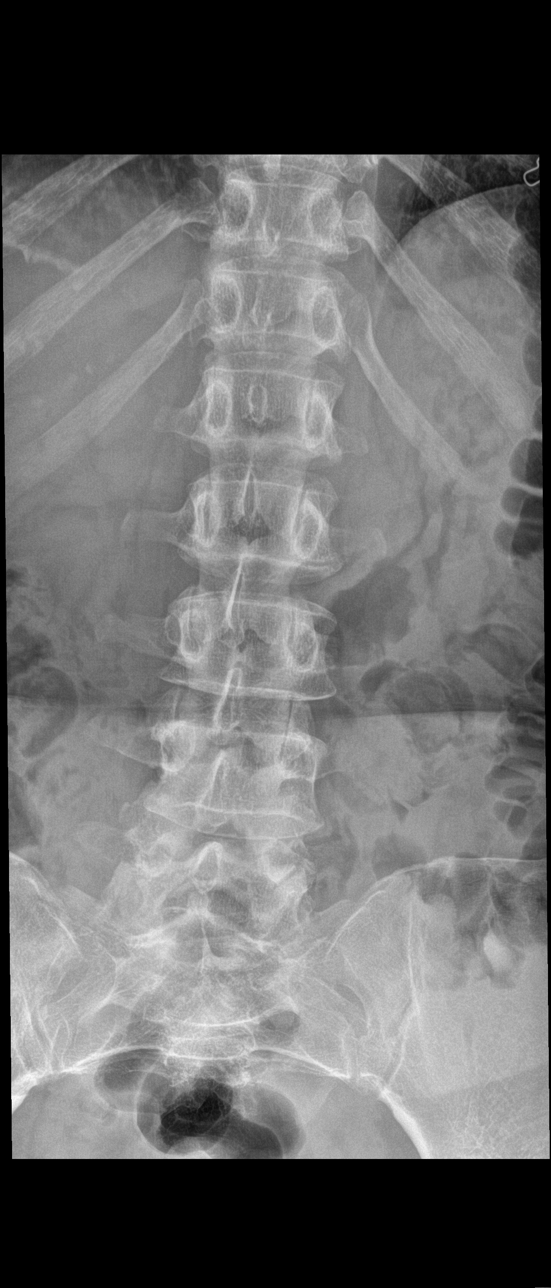

[l-spine obl (1 of 2)]
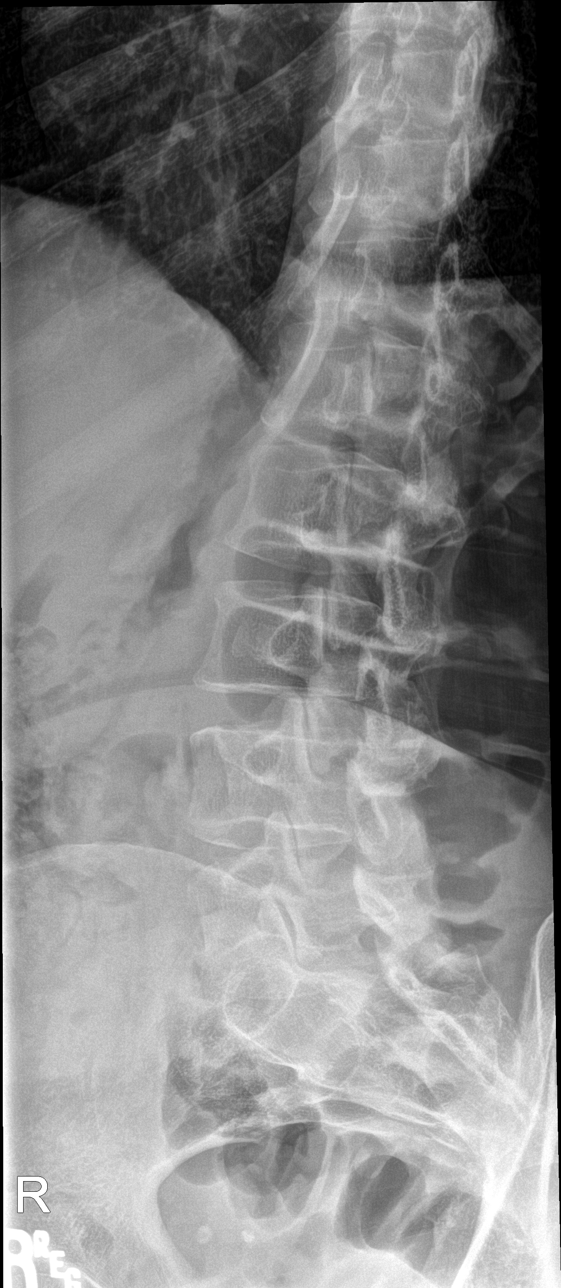

[l-spine obl (2 of 2)]
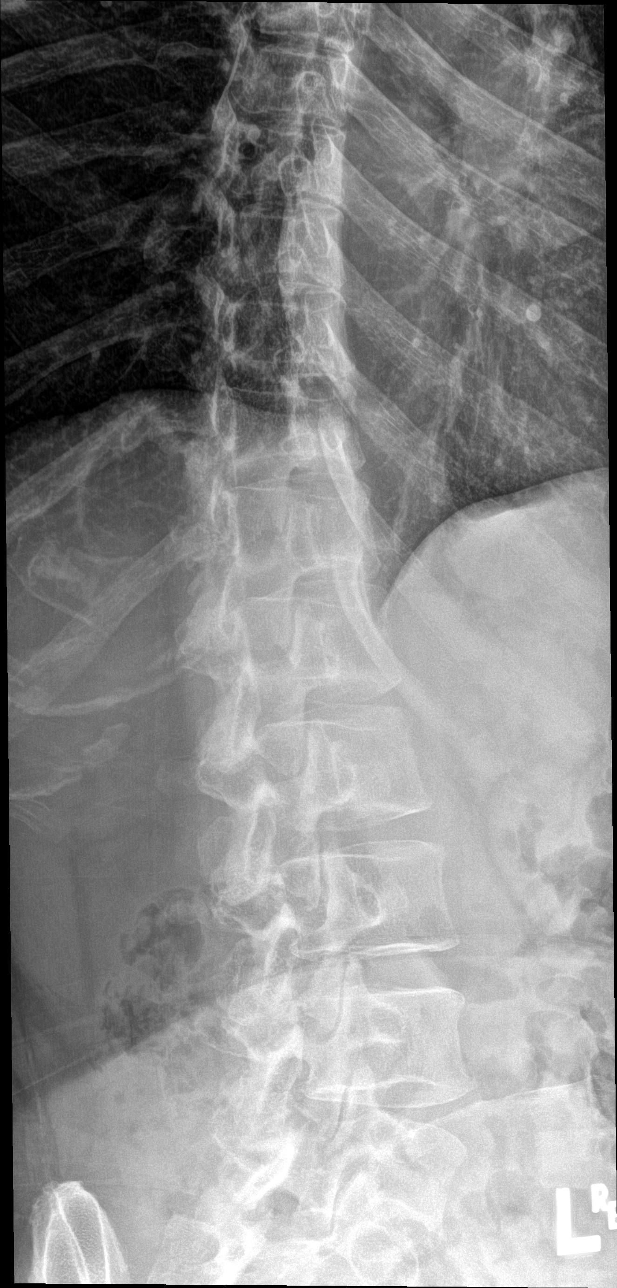

[l-spine lat]
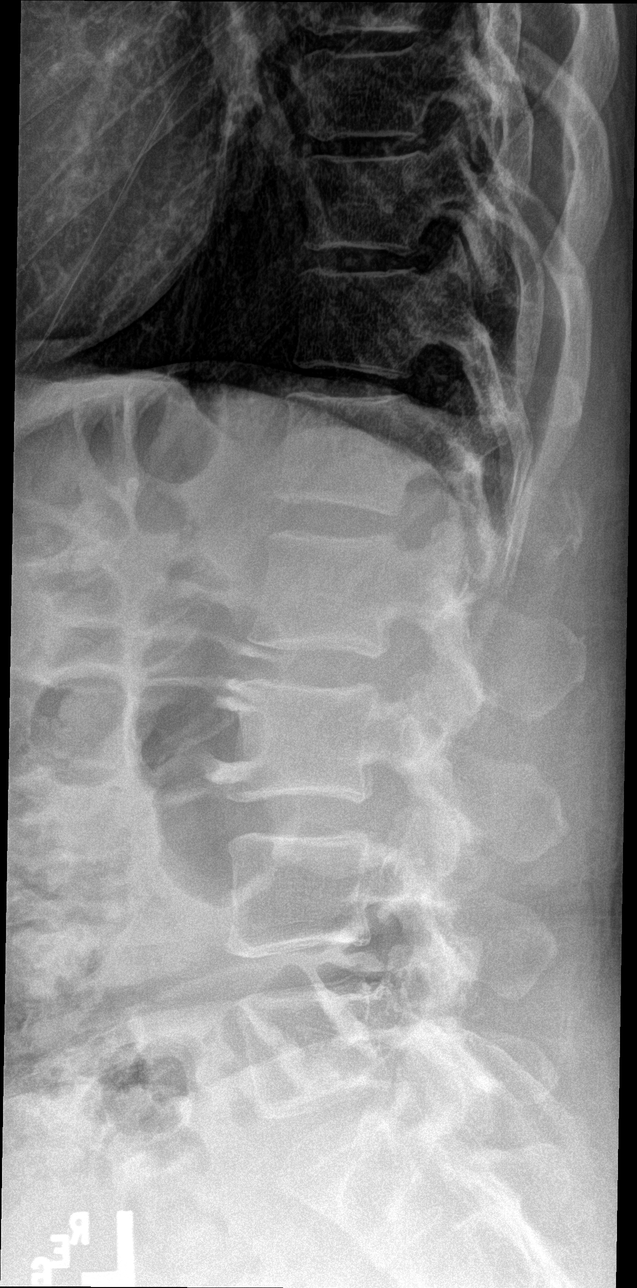

[l-spine spot]
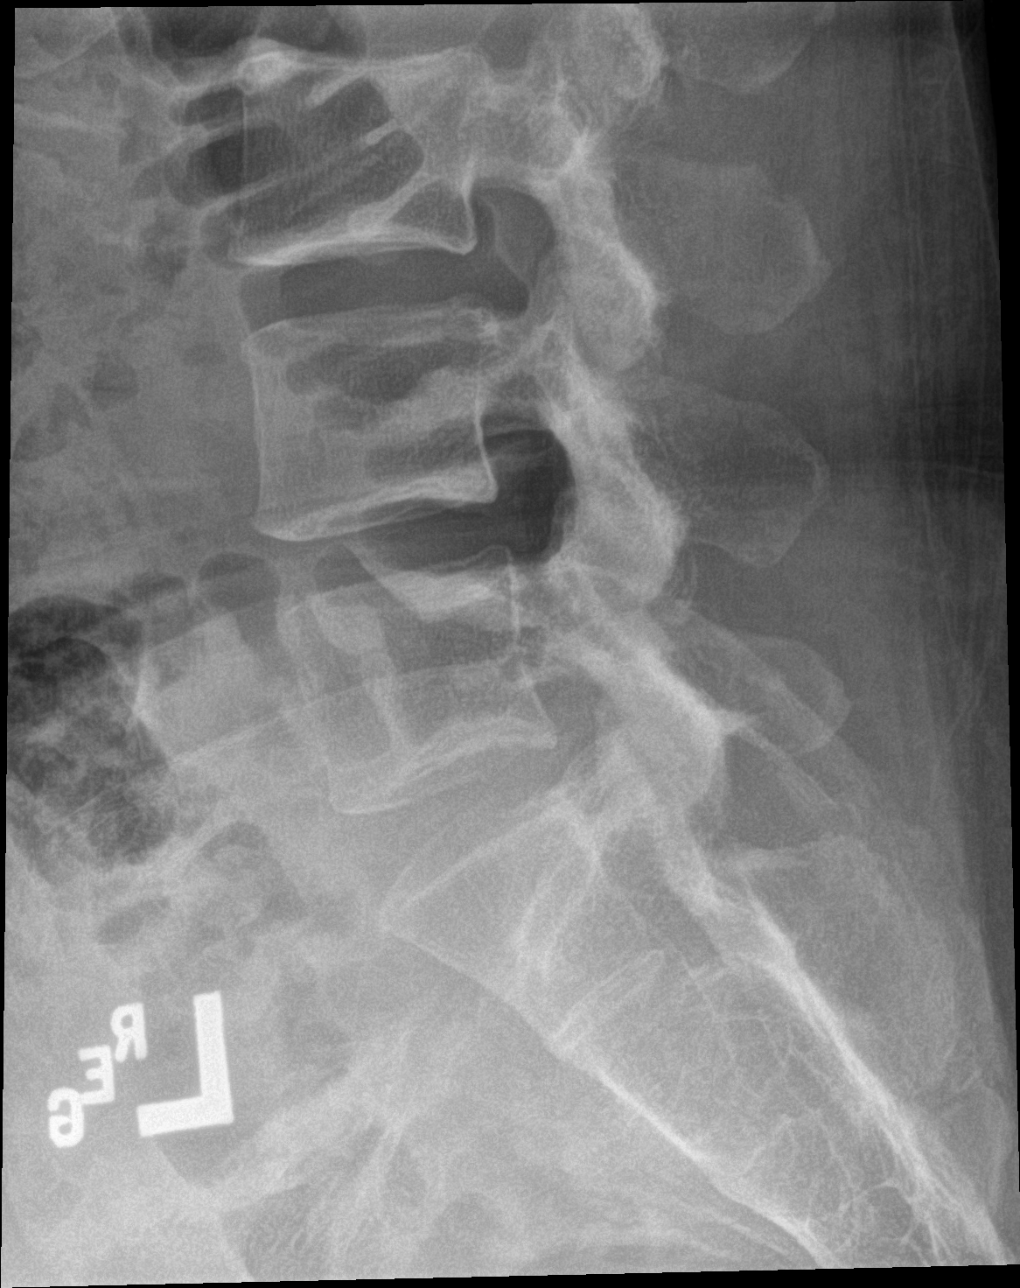

[5 of 5 positions shown; findings below may reference images not displayed]

FINDINGS: There is no evidence of lumbar spine fracture. Alignment is normal.
Intervertebral disc spaces are maintained.
IMPRESSION: Normal lumbar spine.

## 2016-04-07 ENCOUNTER — Other Ambulatory Visit: Payer: Self-pay | Admitting: Family Medicine

## 2016-04-07 DIAGNOSIS — M542 Cervicalgia: Secondary | ICD-10-CM

## 2016-04-07 MED ORDER — HYDROCODONE-ACETAMINOPHEN 10-325 MG PO TABS: 1.0000 | ORAL_TABLET | Freq: Three times a day (TID) | ORAL | 0 refills | Status: DC | PRN

## 2016-04-07 NOTE — Telephone Encounter (Signed)
Caller name: Relationship to patient: Self Can be reached: [phone] Pharmacy:  [id number] - [place],  - [address] [phone] (Phone) [phone] (Fax)     Reason for call: Request refill on HYDROcodone-acetaminophen (NORCO) 10-325 MG tablet [[id number]][[id number]]

## 2016-04-07 NOTE — Telephone Encounter (Signed)
Requesting:  Hydrocodone Contract  None UDS   None Last OV    01/12/2016 Last Refill    #60 with 0 refills on 03/03/2016  Please Advise

## 2016-04-07 NOTE — Telephone Encounter (Signed)
Patient informed to pickup hardcopy at the front desk. 

## 2016-04-11 ENCOUNTER — Encounter: Payer: Self-pay | Admitting: Family Medicine

## 2016-04-11 ENCOUNTER — Ambulatory Visit (INDEPENDENT_AMBULATORY_CARE_PROVIDER_SITE_OTHER): Payer: Managed Care, Other (non HMO) | Admitting: Family Medicine

## 2016-04-11 VITALS — BP 120/72 | HR 62 | Temp 98.0°F | Ht 63.0 in | Wt 145.4 lb

## 2016-04-11 DIAGNOSIS — I10 Essential (primary) hypertension: Secondary | ICD-10-CM

## 2016-04-11 DIAGNOSIS — M545 Low back pain, unspecified: Secondary | ICD-10-CM

## 2016-04-11 DIAGNOSIS — E782 Mixed hyperlipidemia: Secondary | ICD-10-CM | POA: Diagnosis not present

## 2016-04-11 DIAGNOSIS — M25579 Pain in unspecified ankle and joints of unspecified foot: Secondary | ICD-10-CM | POA: Insufficient documentation

## 2016-04-11 DIAGNOSIS — R202 Paresthesia of skin: Secondary | ICD-10-CM

## 2016-04-11 DIAGNOSIS — M25572 Pain in left ankle and joints of left foot: Secondary | ICD-10-CM | POA: Diagnosis not present

## 2016-04-11 HISTORY — DX: Low back pain, unspecified: M54.50

## 2016-04-11 MED ORDER — CLOBETASOL PROPIONATE 0.05 % EX OINT: 1.0000 "application " | TOPICAL_OINTMENT | Freq: Two times a day (BID) | CUTANEOUS | 1 refills | Status: DC

## 2016-04-11 MED ORDER — MUPIROCIN CALCIUM 2 % EX CREA: 1.0000 "application " | TOPICAL_CREAM | Freq: Two times a day (BID) | CUTANEOUS | 1 refills | Status: DC

## 2016-04-11 NOTE — Progress Notes (Signed)
Pre visit review using our clinic review tool, if applicable. No additional management support is needed unless otherwise documented below in the visit note. 

## 2016-04-11 NOTE — Progress Notes (Signed)
Patient ID: Suzanne Barber, female   DOB: 1955-11-30, 60 y.o.   MRN: [id number]   Subjective:    Patient ID: Suzanne Barber, female    DOB: 08-31-1955, 60 y.o.   MRN: [id number]  Chief Complaint  Patient presents with  . Back Pain    HPI Patient is in today for follow up today. Has been struggling with intermittent left ankle pain x 2 days but denies any injury. Pain is sharp when she steps on it. No redness or warmth. Also notes intermittent low back pain this month. Believes it is related to sleeping in a bad bed, no incontinence or radiculopathy. No recent illness paresthesias better with increased Lyrica. Denies CP/palp/SOB/HA/congestion/fevers/GI or GU c/o. Taking meds as prescribed  Past Medical History:  Diagnosis Date  . ADD (attention deficit disorder)   . Depression   . Depression with anxiety   . Frequent headaches   . Hyperlipidemia, mixed 09/06/2015  . Hypertension   . Low back pain 04/11/2016  . OAB (overactive bladder)   . Preventative health care 09/06/2015  . Urine incontinence     Past Surgical History:  Procedure Laterality Date  . BREAST BIOPSY  2006    Family History  Problem Relation Age of Onset  . Diabetes Mother   . Colon cancer Mother   . Stroke Mother   . Hypertension Mother   . Colon cancer Sister   . Cancer Sister 34    colon  . Diabetes Sister   . Breast cancer Sister   . Cancer Sister     breast  . Breast cancer Maternal Grandmother   . Alcohol abuse Father     cirrhosis  . Diverticulitis Father   . Cancer Sister     cervix  . Heart disease Brother 52  . Mental illness Brother     schizophrenia    Social History   Social History  . Marital status: Married    Spouse name: N/A  . Number of children: N/A  . Years of education: N/A   Occupational History  . Not on file.   Social History Main Topics  . Smoking status: Former Smoker    Quit date: 05/14/1983  . Smokeless tobacco: Not on file  . Alcohol use Yes  . Drug  use: No  . Sexual activity: Yes     Comment: married, husband in [place], no major dietary restrictions. eats hi protein minimizes sugar   Other Topics Concern  . Not on file   Social History Narrative  . No narrative on file    Outpatient Medications Prior to Visit  Medication Sig Dispense Refill  . ALPRAZolam (XANAX) 0.25 MG tablet Take 1 tablet (0.25 mg total) by mouth 2 (two) times daily as needed for anxiety. 40 tablet 2  . amphetamine-dextroamphetamine (ADDERALL) 5 MG tablet Take 1 tablet (5 mg total) by mouth 2 (two) times daily with a meal. october 2016 60 tablet 0  . aspirin 81 MG EC tablet Take 81 mg by mouth daily.    . Cholecalciferol (VITAMIN D) 2000 UNITS CAPS Take by mouth.    . citalopram (CELEXA) 40 MG tablet TAKE 1 TABLET BY MOUTH EVERY DAY 30 tablet 3  . cyclobenzaprine (FLEXERIL) 10 MG tablet Take 1 tablet (10 mg total) by mouth 2 (two) times daily as needed for muscle spasms. 40 tablet 0  . HYDROcodone-acetaminophen (NORCO) 10-325 MG tablet Take 1 tablet by mouth every 8 (eight) hours as needed. 60 tablet 0  .  hydrOXYzine (ATARAX/VISTARIL) 25 MG tablet Take 1 tablet (25 mg total) by mouth every 8 (eight) hours as needed for itching. 21 tablet 0  . ibuprofen (ADVIL,MOTRIN) 600 MG tablet Take 1 tablet (600 mg total) by mouth every 6 (six) hours as needed. 90 tablet 2  . Multiple Vitamin (MULTIVITAMIN) tablet Take 1 tablet by mouth daily.    . Omega-3 Fatty Acids (OMEGA 3 PO) Take by mouth 2 (two) times daily.    . pregabalin (LYRICA) 75 MG capsule Take 1 capsule (75 mg total) by mouth 3 (three) times daily. 90 capsule 3  . clobetasol ointment (TEMOVATE) 0.05 % Apply 1 application topically 2 (two) times daily. 30 g 0  . mupirocin cream (BACTROBAN) 2 % Apply 1 application topically 2 (two) times daily. 15 g 0  . cetirizine (ZYRTEC) 10 MG tablet Take 10 mg by mouth daily. Reported on 12/08/2015     No facility-administered medications prior to visit.     Allergies    Allergen Reactions  . Shellfish Allergy Rash  . Penicillins Itching and Swelling  . Latex Rash    Review of Systems  Constitutional: Negative for fever and malaise/fatigue.  HENT: Negative for congestion.   Respiratory: Negative for shortness of breath.   Cardiovascular: Negative for chest pain, palpitations and leg swelling.  Gastrointestinal: Negative for abdominal pain, blood in stool and nausea.  Genitourinary: Negative for dysuria and frequency.  Musculoskeletal: Positive for back pain and joint pain. Negative for falls.  Neurological: Positive for tingling and sensory change. Negative for dizziness, loss of consciousness and headaches.  Endo/Heme/Allergies: Negative for environmental allergies.  Psychiatric/Behavioral: Negative for depression. The patient is not nervous/anxious.        Objective:    Physical Exam  Constitutional: She is oriented to person, place, and time. She appears well-developed and well-nourished. No distress.  HENT:  Head: Normocephalic and atraumatic.  Nose: Nose normal.  Eyes: Right eye exhibits no discharge. Left eye exhibits no discharge.  Neck: Normal range of motion. Neck supple.  Cardiovascular: Normal rate and regular rhythm.   No murmur heard. Pulmonary/Chest: Effort normal and breath sounds normal.  Abdominal: Soft. Bowel sounds are normal. There is no tenderness.  Musculoskeletal: She exhibits no edema.  Neurological: She is alert and oriented to person, place, and time.  Skin: Skin is warm and dry.  Psychiatric: She has a normal mood and affect.  Nursing note and vitals reviewed.   BP 120/72 (BP Location: Left Arm, Patient Position: Sitting, Cuff Size: Normal)   Pulse 62   Temp 98 F (36.7 C) (Oral)   Ht 5\' 3"  (1.6 m)   Wt 145 lb 6 oz (65.9 kg)   SpO2 97%   BMI 25.75 kg/m  Wt Readings from Last 3 Encounters:  04/11/16 145 lb 6 oz (65.9 kg)  01/12/16 150 lb 2 oz (68.1 kg)  12/08/15 140 lb (63.5 kg)     Lab Results   Component Value Date   WBC 5.9 08/27/2015   HGB 13.3 08/27/2015   HCT 42.0 08/27/2015   PLT 276.0 08/27/2015   GLUCOSE 78 08/27/2015   CHOL 248 (H) 08/27/2015   TRIG 51.0 08/27/2015   HDL 73.50 08/27/2015   LDLCALC 164 (H) 08/27/2015   ALT 10 08/27/2015   AST 16 08/27/2015   NA 141 08/27/2015   K 4.1 08/27/2015   CL 105 08/27/2015   CREATININE 0.84 08/27/2015   BUN 12 08/27/2015   CO2 31 08/27/2015   TSH 0.62  08/27/2015    Lab Results  Component Value Date   TSH 0.62 08/27/2015   Lab Results  Component Value Date   WBC 5.9 08/27/2015   HGB 13.3 08/27/2015   HCT 42.0 08/27/2015   MCV 85.4 08/27/2015   PLT 276.0 08/27/2015   Lab Results  Component Value Date   NA 141 08/27/2015   K 4.1 08/27/2015   CO2 31 08/27/2015   GLUCOSE 78 08/27/2015   BUN 12 08/27/2015   CREATININE 0.84 08/27/2015   BILITOT 0.3 08/27/2015   ALKPHOS 70 08/27/2015   AST 16 08/27/2015   ALT 10 08/27/2015   PROT 7.6 08/27/2015   ALBUMIN 4.2 08/27/2015   CALCIUM 9.7 08/27/2015   GFR 89.43 08/27/2015   Lab Results  Component Value Date   CHOL 248 (H) 08/27/2015   Lab Results  Component Value Date   HDL 73.50 08/27/2015   Lab Results  Component Value Date   LDLCALC 164 (H) 08/27/2015   Lab Results  Component Value Date   TRIG 51.0 08/27/2015   Lab Results  Component Value Date   CHOLHDL 3 08/27/2015   No results found for: HGBA1C     Assessment & Plan:   Problem List Items Addressed This Visit    Hypertension    Well controlled, no changes to meds. Encouraged heart healthy diet such as the DASH diet and exercise as tolerated.       Relevant Orders   Comp Met (CMET)   Paresthesias    CTS symptoms, improved some with increased Lyrica no further changes at this time although she is encouragedt o try topical lidocaine gel prn      Hyperlipidemia, mixed - Primary    Encouraged heart healthy diet, increase exercise, avoid trans fats, consider a krill oil cap daily.  Recheck lipid today      Relevant Orders   Lipid panel   Low back pain    Encouraged moist heat and gentle stretching as tolerated. May try NSAIDs and topical treatments as directed and report if symptoms worsen or seek immediate care. Report if no improvement      Pain in joint, ankle and foot    Left foot x 2 days, no injury encouraged topical treatments and report if pain worsens for imaging and possible referral       Other Visit Diagnoses   None.     I am having Ms. Mcgurn maintain her aspirin, Vitamin D, multivitamin, Omega-3 Fatty Acids (OMEGA 3 PO), cetirizine, hydrOXYzine, amphetamine-dextroamphetamine, ibuprofen, citalopram, cyclobenzaprine, pregabalin, ALPRAZolam, HYDROcodone-acetaminophen, mupirocin cream, and clobetasol ointment.  Meds ordered this encounter  Medications  . mupirocin cream (BACTROBAN) 2 %    Sig: Apply 1 application topically 2 (two) times daily.    Dispense:  15 g    Refill:  1    Apply to posterior neck.  . clobetasol ointment (TEMOVATE) 0.05 %    Sig: Apply 1 application topically 2 (two) times daily.    Dispense:  30 g    Refill:  1     BLYTH, STACEY, MD

## 2016-04-11 NOTE — Assessment & Plan Note (Signed)
Left foot x 2 days, no injury encouraged topical treatments and report if pain worsens for imaging and possible referral

## 2016-04-11 NOTE — Assessment & Plan Note (Signed)
Encouraged heart healthy diet, increase exercise, avoid trans fats, consider a krill oil cap daily. Recheck lipid today

## 2016-04-11 NOTE — Patient Instructions (Addendum)
Consider lidocaine patches to back and/or gel to hands [employer], [employer]cre[employer], salon [place]  Cholesterol Cholesterol is a white, waxy, fat-like substance needed by your body in small amounts. The liver makes all the cholesterol you need. Cholesterol is carried from the liver by the blood through the blood vessels. Deposits of cholesterol (plaque) may build up on blood vessel walls. These make the arteries narrower and stiffer. Cholesterol plaques increase the risk for heart attack and stroke.  You cannot feel your cholesterol level even if it is very high. The only way to know it is high is with a blood test. Once you know your cholesterol levels, you should keep a record of the test results. Work with your health care provider to keep your levels in the desired range.  WHAT DO THE RESULTS MEAN?  Total cholesterol is a rough measure of all the cholesterol in your blood.   LDL is the so-called bad . This is the type that deposits cholesterol in the walls of the arteries. You want this level to be low.   HDL is the good cholesterol because it cleans the arteries and carries the LDL away. You want this level to be high.  Triglycerides are fat that the body can either burn for energy or store. High levels are closely linked to heart disease.  WHAT ARE THE DESIRED LEVELS OF CHOLESTEROL?  Total cholesterol below 200.   LDL below 100 for people at risk, below 70 for those at very high risk.   HDL above 50 is good, above 60 is best.   Triglycerides below 150.  HOW CAN I LOWER MY CHOLESTEROL?  Diet. Follow your diet programs as directed by your health care provider.   Choose fish or white meat chicken and [place], roasted or baked. Limit fatty cuts of red meat, fried foods, and processed meats, such as sausage and lunch meats.   Eat lots of fresh fruits and vegetables.  Choose whole grains, beans, pasta, potatoes, and cereals.   Use only small amounts of olive, corn, or canola  oils.   Avoid butter, mayonnaise, shortening, or palm kernel oils.  Avoid foods with trans fats.   Drink skim or nonfat milk and eat low-fat or nonfat yogurt and cheeses. Avoid whole milk, cream, ice cream, egg yolks, and full-fat cheeses.   Healthy desserts include angel food cake, ginger snaps, animal crackers, hard candy, popsicles, and low-fat or nonfat frozen yogurt. Avoid pastries, cakes, pies, and cookies.   Exercise. Follow your exercise programs as directed by your health care provider.   A regular program helps decrease LDL and raise HDL.   A regular program helps with weight control.   Do things that increase your activity level like gardening, walking, or taking the stairs. Ask your health care provider about how you can be more active in your daily life.   Medicine. Take medicine only as directed by your health care provider.   Medicine may be prescribed by your health care provider to help lower cholesterol and decrease the risk for heart disease.   If you have several risk factors, you may need medicine even if your levels are normal.   This information is not intended to replace advice given to you by your health care provider. Make sure you discuss any questions you have with your health care provider.   Document Released: 05/03/2001 Document Revised: 08/29/2014 Document Reviewed: 05/22/2013 Elsevier Interactive Patient Education [employer]2016 Elsevier Inc.

## 2016-04-11 NOTE — Assessment & Plan Note (Signed)
Well controlled, no changes to meds. Encouraged heart healthy diet such as the DASH diet and exercise as tolerated.  °

## 2016-04-11 NOTE — Assessment & Plan Note (Signed)
Encouraged moist heat and gentle stretching as tolerated. May try NSAIDs and topical treatments as directed and report if symptoms worsen or seek immediate care. Report if no improvement

## 2016-04-11 NOTE — Assessment & Plan Note (Signed)
CTS symptoms, improved some with increased Lyrica no further changes at this time although she is encouragedt o try topical lidocaine gel prn

## 2016-04-12 ENCOUNTER — Ambulatory Visit: Payer: Managed Care, Other (non HMO) | Admitting: Family Medicine

## 2016-04-12 LAB — COMPREHENSIVE METABOLIC PANEL
ALK PHOS: 66 U/L (ref 39–117)
ALT: 14 U/L (ref 0–35)
AST: 16 U/L (ref 0–37)
Albumin: 4.3 g/dL (ref 3.5–5.2)
BILIRUBIN TOTAL: 0.3 mg/dL (ref 0.2–1.2)
BUN: 7 mg/dL (ref 6–23)
CO2: 30 mEq/L (ref 19–32)
CREATININE: 0.82 mg/dL (ref 0.40–1.20)
Calcium: 9.3 mg/dL (ref 8.4–10.5)
Chloride: 103 mEq/L (ref 96–112)
GFR: 91.76 mL/min (ref >60.00)
GLUCOSE: 83 mg/dL (ref 70–99)
Potassium: 4 mEq/L (ref 3.5–5.1)
Sodium: 140 mEq/L (ref 135–145)
TOTAL PROTEIN: 7.5 g/dL (ref 6.0–8.3)

## 2016-04-12 LAB — LIPID PANEL
Cholesterol: 237 mg/dL — ABNORMAL HIGH (ref 0–200)
HDL: 66.1 mg/dL (ref >39.00)
LDL Cholesterol: 138 mg/dL — ABNORMAL HIGH (ref 0–99)
NONHDL: 170.92
Total CHOL/HDL Ratio: 4
Triglycerides: 163 mg/dL — ABNORMAL HIGH (ref 0.0–149.0)
VLDL: 32.6 mg/dL (ref 0.0–40.0)

## 2016-05-13 ENCOUNTER — Telehealth: Payer: Self-pay | Admitting: Family Medicine

## 2016-05-13 DIAGNOSIS — M542 Cervicalgia: Secondary | ICD-10-CM

## 2016-05-13 MED ORDER — HYDROCODONE-ACETAMINOPHEN 10-325 MG PO TABS: 1.0000 | ORAL_TABLET | Freq: Three times a day (TID) | ORAL | 0 refills | Status: DC | PRN

## 2016-05-13 MED ORDER — ALPRAZOLAM 0.25 MG PO TABS: 0.2500 mg | ORAL_TABLET | Freq: Two times a day (BID) | ORAL | 2 refills | Status: DC | PRN

## 2016-05-13 NOTE — Telephone Encounter (Signed)
Ok to refill both meds as requested

## 2016-05-13 NOTE — Telephone Encounter (Signed)
Printed hardcopy of both and called the patient informed to pickup hardcopy's at the front desk.

## 2016-05-13 NOTE — Telephone Encounter (Signed)
Patient Relation: Self Phone number: [phone]    ALPRAZolam (XANAX) 0.25 MG tablet  And HYDROcodone-acetaminophen (NORCO) 10-325 MG tablet .lbp

## 2016-05-13 NOTE — Telephone Encounter (Signed)
Requesting:  Alprazolam and hydrocodone Contract  None UDS  [facility] Last OV  04/11/2016 Last Refill  Alprazolam  #40 with 2 refills on 01/12/2016                   Hydrocodone  #60 with 0 refills on 04/07/2016  Please Advise

## 2016-07-11 ENCOUNTER — Telehealth: Payer: Self-pay | Admitting: Family Medicine

## 2016-07-11 DIAGNOSIS — M542 Cervicalgia: Secondary | ICD-10-CM

## 2016-07-11 MED ORDER — ALPRAZOLAM 0.25 MG PO TABS: 0.2500 mg | ORAL_TABLET | Freq: Two times a day (BID) | ORAL | 2 refills | Status: DC | PRN

## 2016-07-11 MED ORDER — HYDROCODONE-ACETAMINOPHEN 10-325 MG PO TABS: 1.0000 | ORAL_TABLET | Freq: Three times a day (TID) | ORAL | 0 refills | Status: DC | PRN

## 2016-07-11 NOTE — Telephone Encounter (Signed)
Printed and on counter for signature. Called the patient (left a detailed message) to inform to pickup both hydrocodone and alprazolam (printed on same sheet of prescription paper)

## 2016-07-11 NOTE — Telephone Encounter (Signed)
Patient is requesting a refill of HYDROcodone-acetaminophen (NORCO) 10-325 MG tablet and ALPRAZolam (XANAX) 0.25 MG tablet  Please advise.     Patient phone: [phone]

## 2016-07-11 NOTE — Telephone Encounter (Signed)
Ok to refill pain med

## 2016-07-22 ENCOUNTER — Ambulatory Visit (INDEPENDENT_AMBULATORY_CARE_PROVIDER_SITE_OTHER): Payer: Managed Care, Other (non HMO) | Admitting: Family Medicine

## 2016-07-22 VITALS — BP 108/78 | HR 71 | Temp 98.0°F | Ht 63.0 in | Wt 144.5 lb

## 2016-07-22 DIAGNOSIS — I10 Essential (primary) hypertension: Secondary | ICD-10-CM | POA: Diagnosis not present

## 2016-07-22 DIAGNOSIS — F329 Major depressive disorder, single episode, unspecified: Secondary | ICD-10-CM

## 2016-07-22 DIAGNOSIS — E782 Mixed hyperlipidemia: Secondary | ICD-10-CM

## 2016-07-22 DIAGNOSIS — F418 Other specified anxiety disorders: Secondary | ICD-10-CM

## 2016-07-22 DIAGNOSIS — F419 Anxiety disorder, unspecified: Principal | ICD-10-CM

## 2016-07-22 MED ORDER — ALPRAZOLAM 0.25 MG PO TABS: 0.2500 mg | ORAL_TABLET | Freq: Two times a day (BID) | ORAL | 1 refills | Status: DC | PRN

## 2016-07-22 MED ORDER — PREGABALIN 75 MG PO CAPS: 75.0000 mg | ORAL_CAPSULE | Freq: Three times a day (TID) | ORAL | 3 refills | Status: DC

## 2016-07-22 MED ORDER — VENLAFAXINE HCL ER 75 MG PO CP24: 75.0000 mg | ORAL_CAPSULE | Freq: Every day | ORAL | 2 refills | Status: DC

## 2016-07-22 MED ORDER — VENLAFAXINE HCL ER 37.5 MG PO CP24: 37.5000 mg | ORAL_CAPSULE | Freq: Every day | ORAL | 0 refills | Status: DC

## 2016-07-22 NOTE — Progress Notes (Signed)
Pre visit review using our clinic review tool, if applicable. No additional management support is needed unless otherwise documented below in the visit note. 

## 2016-07-22 NOTE — Patient Instructions (Addendum)
100-140/60-90 blood pressure 60-90 pulse  Take 1/2 Citalopram tab daily for 14 days then stop  Generalized Anxiety Disorder Generalized anxiety disorder (GAD) is a mental disorder. It interferes with life functions, including relationships, work, and school. GAD is different from normal anxiety, which everyone experiences at some point in their lives in response to specific life events and activities. Normal anxiety actually helps us prepare for and get through these life events and activities. Normal anxiety goes away after the event or activity is over.  GAD causes anxiety that is not necessarily related to specific events or activities. It also causes excess anxiety in proportion to specific events or activities. The anxiety associated with GAD is also difficult to control. GAD can vary from mild to severe. People with severe GAD can have intense waves of anxiety with physical symptoms (panic attacks).  SYMPTOMS The anxiety and worry associated with GAD are difficult to control. This anxiety and worry are related to many life events and activities and also occur more days than not for 6 months or longer. People with GAD also have three or more of the following symptoms (one or more in children):  Restlessness.   Fatigue.  Difficulty concentrating.   Irritability.  Muscle tension.  Difficulty sleeping or unsatisfying sleep. DIAGNOSIS GAD is diagnosed through an assessment by your health care provider. Your health care provider will ask you questions aboutyour mood,physical symptoms, and events in your life. Your health care provider may ask you about your medical history and use of alcohol or drugs, including prescription medicines. Your health care provider may also do a physical exam and blood tests. Certain medical conditions and the use of certain substances can cause symptoms similar to those associated with GAD. Your health care provider may refer you to a mental health specialist  for further evaluation. TREATMENT The following therapies are usually used to treat GAD:   Medication. Antidepressant medication usually is prescribed for long-term daily control. Antianxiety medicines may be added in severe cases, especially when panic attacks occur.   Talk therapy (psychotherapy). Certain types of talk therapy can be helpful in treating GAD by providing support, education, and guidance. A form of talk therapy called cognitive behavioral therapy can teach you healthy ways to think about and react to daily life events and activities.  Stress managementtechniques. These include yoga, meditation, and exercise and can be very helpful when they are practiced regularly. A mental health specialist can help determine which treatment is best for you. Some people see improvement with one therapy. However, other people require a combination of therapies. This information is not intended to replace advice given to you by your health care provider. Make sure you discuss any questions you have with your health care provider. Document Released: 12/03/2012 Document Revised: 08/29/2014 Document Reviewed: 12/03/2012 Elsevier Interactive Patient Education  2017 [employer]

## 2016-08-07 NOTE — Progress Notes (Signed)
Patient ID: Suzanne Barber, female   DOB: [birth year], 59 y.o.   MRN: [id number]   Subjective:    Patient ID: Suzanne Barber, female    DOB: 25-Aug-1955, 60 y.o.   MRN: [id number]  Chief Complaint  Patient presents with  . Headache  . Hypertension    HPI Patient is in today for follow up. She acknowledges ongoing anxiety and anhedonia. No suicidal ideation. No recent illness but is struggling with chronic poor sleep and ongoing neck and back pain as well as headache. Patient notes some occasional tingling in hands as well. No recent fall or injury. No recent hospitalization. Denies CP/palp/SOB/congestion/fevers/GI or GU c/o. Taking meds as prescribed  Past Medical History:  Diagnosis Date  . ADD (attention deficit disorder)   . Depression   . Depression with anxiety   . Frequent headaches   . Hyperlipidemia, mixed 09/06/2015  . Hypertension   . Low back pain 04/11/2016  . OAB (overactive bladder)   . Preventative health care 09/06/2015  . Urine incontinence     Past Surgical History:  Procedure Laterality Date  . BREAST BIOPSY  2006    Family History  Problem Relation Age of Onset  . Diabetes Mother   . Colon cancer Mother   . Stroke Mother   . Hypertension Mother   . Colon cancer Sister   . Cancer Sister 51    colon  . Diabetes Sister   . Breast cancer Sister   . Cancer Sister     breast  . Breast cancer Maternal Grandmother   . Alcohol abuse Father     cirrhosis  . Diverticulitis Father   . Cancer Sister     cervix  . Heart disease Brother 48  . Mental illness Brother     schizophrenia    Social History   Social History  . Marital status: Married    Spouse name: N/A  . Number of children: N/A  . Years of education: N/A   Occupational History  . Not on file.   Social History Main Topics  . Smoking status: Former Smoker    Quit date: 05/14/1983  . Smokeless tobacco: Not on file  . Alcohol use Yes  . Drug use: No  . Sexual activity: Yes   Comment: married, husband in [place], no major dietary restrictions. eats hi protein minimizes sugar   Other Topics Concern  . Not on file   Social History Narrative  . No narrative on file    Outpatient Medications Prior to Visit  Medication Sig Dispense Refill  . amphetamine-dextroamphetamine (ADDERALL) 5 MG tablet Take 1 tablet (5 mg total) by mouth 2 (two) times daily with a meal. october 2016 60 tablet 0  . aspirin 81 MG EC tablet Take 81 mg by mouth daily.    . Cholecalciferol (VITAMIN D) 2000 UNITS CAPS Take by mouth.    Marland Kitchen HYDROcodone-acetaminophen (NORCO) 10-325 MG tablet Take 1 tablet by mouth every 8 (eight) hours as needed. 60 tablet 0  . hydrOXYzine (ATARAX/VISTARIL) 25 MG tablet Take 1 tablet (25 mg total) by mouth every 8 (eight) hours as needed for itching. 21 tablet 0  . ibuprofen (ADVIL,MOTRIN) 600 MG tablet Take 1 tablet (600 mg total) by mouth every 6 (six) hours as needed. 90 tablet 2  . Multiple Vitamin (MULTIVITAMIN) tablet Take 1 tablet by mouth daily.    . mupirocin cream (BACTROBAN) 2 % Apply 1 application topically 2 (two) times daily. 15 g 1  .  Omega-3 Fatty Acids (OMEGA 3 PO) Take by mouth 2 (two) times daily.    Marland Kitchen. ALPRAZolam (XANAX) 0.25 MG tablet Take 1 tablet (0.25 mg total) by mouth 2 (two) times daily as needed for anxiety. 40 tablet 2  . citalopram (CELEXA) 40 MG tablet TAKE 1 TABLET BY MOUTH EVERY DAY 30 tablet 3  . clobetasol ointment (TEMOVATE) 0.05 % Apply 1 application topically 2 (two) times daily. 30 g 1  . cyclobenzaprine (FLEXERIL) 10 MG tablet Take 1 tablet (10 mg total) by mouth 2 (two) times daily as needed for muscle spasms. 40 tablet 0  . pregabalin (LYRICA) 75 MG capsule Take 1 capsule (75 mg total) by mouth 3 (three) times daily. 90 capsule 3  . cetirizine (ZYRTEC) 10 MG tablet Take 10 mg by mouth daily. Reported on 12/08/2015     No facility-administered medications prior to visit.     Allergies  Allergen Reactions  . Shellfish Allergy  Rash  . Penicillins Itching and Swelling  . Latex Rash    Review of Systems  Constitutional: Negative for fever and malaise/fatigue.  HENT: Negative for congestion.   Eyes: Negative for blurred vision.  Respiratory: Negative for shortness of breath.   Cardiovascular: Negative for chest pain, palpitations and leg swelling.  Gastrointestinal: Negative for abdominal pain, blood in stool and nausea.  Genitourinary: Negative for dysuria and frequency.  Musculoskeletal: Positive for back pain, joint pain, myalgias and neck pain. Negative for falls.  Skin: Negative for rash.  Neurological: Positive for sensory change and headaches. Negative for dizziness and loss of consciousness.  Endo/Heme/Allergies: Negative for environmental allergies.  Psychiatric/Behavioral: Positive for depression. The patient is nervous/anxious and has insomnia.        Objective:    Physical Exam  Constitutional: She is oriented to person, place, and time. She appears well-developed and well-nourished. No distress.  HENT:  Head: Normocephalic and atraumatic.  Nose: Nose normal.  Eyes: Right eye exhibits no discharge. Left eye exhibits no discharge.  Neck: Normal range of motion. Neck supple.  Cardiovascular: Normal rate and regular rhythm.   No murmur heard. Pulmonary/Chest: Effort normal and breath sounds normal.  Abdominal: Soft. Bowel sounds are normal. There is no tenderness.  Musculoskeletal: She exhibits no edema.  Neurological: She is alert and oriented to person, place, and time.  Skin: Skin is warm and dry.  Psychiatric: She has a normal mood and affect.  Nursing note and vitals reviewed.   BP 108/78 (BP Location: Left Arm, Patient Position: Sitting, Cuff Size: Normal)   Pulse 71   Temp 98 F (36.7 C) (Oral)   Ht 5\' 3"  (1.6 m)   Wt 144 lb 8 oz (65.5 kg)   SpO2 98%   BMI 25.60 kg/m  Wt Readings from Last 3 Encounters:  07/22/16 144 lb 8 oz (65.5 kg)  04/11/16 145 lb 6 oz (65.9 kg)    01/12/16 150 lb 2 oz (68.1 kg)     Lab Results  Component Value Date   WBC 5.9 08/27/2015   HGB 13.3 08/27/2015   HCT 42.0 08/27/2015   PLT 276.0 08/27/2015   GLUCOSE 83 04/11/2016   CHOL 237 (H) 04/11/2016   TRIG 163.0 (H) 04/11/2016   HDL 66.10 04/11/2016   LDLCALC 138 (H) 04/11/2016   ALT 14 04/11/2016   AST 16 04/11/2016   NA 140 04/11/2016   K 4.0 04/11/2016   CL 103 04/11/2016   CREATININE 0.82 04/11/2016   BUN 7 04/11/2016   CO2  30 04/11/2016   TSH 0.62 08/27/2015    Lab Results  Component Value Date   TSH 0.62 08/27/2015   Lab Results  Component Value Date   WBC 5.9 08/27/2015   HGB 13.3 08/27/2015   HCT 42.0 08/27/2015   MCV 85.4 08/27/2015   PLT 276.0 08/27/2015   Lab Results  Component Value Date   NA 140 04/11/2016   K 4.0 04/11/2016   CO2 30 04/11/2016   GLUCOSE 83 04/11/2016   BUN 7 04/11/2016   CREATININE 0.82 04/11/2016   BILITOT 0.3 04/11/2016   ALKPHOS 66 04/11/2016   AST 16 04/11/2016   ALT 14 04/11/2016   PROT 7.5 04/11/2016   ALBUMIN 4.3 04/11/2016   CALCIUM 9.3 04/11/2016   GFR 91.76 04/11/2016   Lab Results  Component Value Date   CHOL 237 (H) 04/11/2016   Lab Results  Component Value Date   HDL 66.10 04/11/2016   Lab Results  Component Value Date   LDLCALC 138 (H) 04/11/2016   Lab Results  Component Value Date   TRIG 163.0 (H) 04/11/2016   Lab Results  Component Value Date   CHOLHDL 4 04/11/2016   No results found for: HGBA1C     Assessment & Plan:   Problem List Items Addressed This Visit    Depression with anxiety    Will try Venlafaxine XR daily, report any concerns      Hypertension    Well controlled, no changes to meds. Encouraged heart healthy diet such as the DASH diet and exercise as tolerated.       Hyperlipidemia, mixed    Encouraged heart healthy diet, increase exercise, avoid trans fats, consider a krill oil cap daily       Other Visit Diagnoses    Anxiety and depression    -  Primary    Relevant Orders   Ambulatory referral to Behavioral Health      I have discontinued Ms. Leffel's citalopram, cyclobenzaprine, and clobetasol ointment. I have also changed her ALPRAZolam and pregabalin. Additionally, I am having her start on venlafaxine XR and venlafaxine XR. Lastly, I am having her maintain her aspirin, Vitamin D, multivitamin, Omega-3 Fatty Acids (OMEGA 3 PO), cetirizine, hydrOXYzine, amphetamine-dextroamphetamine, ibuprofen, mupirocin cream, and HYDROcodone-acetaminophen.  Meds ordered this encounter  Medications  . ALPRAZolam (XANAX) 0.25 MG tablet    Sig: Take 1-2 tablets (0.25-0.5 mg total) by mouth 2 (two) times daily as needed for anxiety.    Dispense:  70 tablet    Refill:  1  . pregabalin (LYRICA) 75 MG capsule    Sig: Take 1-2 capsules (75-150 mg total) by mouth 3 (three) times daily.    Dispense:  120 capsule    Refill:  3  . venlafaxine XR (EFFEXOR XR) 37.5 MG 24 hr capsule    Sig: Take 1 capsule (37.5 mg total) by mouth daily with breakfast.    Dispense:  14 capsule    Refill:  0  . venlafaxine XR (EFFEXOR XR) 75 MG 24 hr capsule    Sig: Take 1 capsule (75 mg total) by mouth daily with breakfast.    Dispense:  30 capsule    Refill:  2     Danise EdgeBLYTH, [place] Fulgham, MD

## 2016-08-07 NOTE — Assessment & Plan Note (Signed)
Encouraged heart healthy diet, increase exercise, avoid trans fats, consider a krill oil cap daily 

## 2016-08-07 NOTE — Assessment & Plan Note (Addendum)
Will try Venlafaxine XR daily, report any concerns

## 2016-08-07 NOTE — Assessment & Plan Note (Signed)
Well controlled, no changes to meds. Encouraged heart healthy diet such as the DASH diet and exercise as tolerated.  °

## 2016-08-08 ENCOUNTER — Telehealth: Payer: Self-pay

## 2016-08-08 NOTE — Telephone Encounter (Signed)
Patient called in on Saturday to [facility] with symptoms of cough,chills,sweats,sore throat,and chest tightness. Follow up call made today. No answer. Left message for return call.

## 2016-08-09 ENCOUNTER — Ambulatory Visit (INDEPENDENT_AMBULATORY_CARE_PROVIDER_SITE_OTHER): Payer: Managed Care, Other (non HMO) | Admitting: Family Medicine

## 2016-08-09 ENCOUNTER — Ambulatory Visit (HOSPITAL_BASED_OUTPATIENT_CLINIC_OR_DEPARTMENT_OTHER)
Admission: RE | Admit: 2016-08-09 | Discharge: 2016-08-09 | Disposition: A | Discharge status: Home / Self Care | Payer: Managed Care, Other (non HMO) | Source: Ambulatory Visit | Attending: Family Medicine | Admitting: Family Medicine

## 2016-08-09 VITALS — BP 110/78 | HR 56 | Temp 98.1°F | Wt 144.4 lb

## 2016-08-09 DIAGNOSIS — F988 Other specified behavioral and emotional disorders with onset usually occurring in childhood and adolescence: Secondary | ICD-10-CM

## 2016-08-09 DIAGNOSIS — M542 Cervicalgia: Secondary | ICD-10-CM

## 2016-08-09 DIAGNOSIS — J209 Acute bronchitis, unspecified: Secondary | ICD-10-CM

## 2016-08-09 DIAGNOSIS — I10 Essential (primary) hypertension: Secondary | ICD-10-CM | POA: Diagnosis not present

## 2016-08-09 DIAGNOSIS — E782 Mixed hyperlipidemia: Secondary | ICD-10-CM | POA: Diagnosis not present

## 2016-08-09 MED ORDER — HYDROCODONE-HOMATROPINE 5-1.5 MG/5ML PO SYRP: 5.0000 mL | ORAL_SOLUTION | Freq: Every evening | ORAL | 0 refills | Status: DC | PRN

## 2016-08-09 MED ORDER — SULFAMETHOXAZOLE-TRIMETHOPRIM 800-160 MG PO TABS: 1.0000 | ORAL_TABLET | Freq: Two times a day (BID) | ORAL | 0 refills | Status: DC

## 2016-08-09 MED ORDER — HYDROCODONE-ACETAMINOPHEN 10-325 MG PO TABS: 1.0000 | ORAL_TABLET | Freq: Three times a day (TID) | ORAL | 0 refills | Status: DC | PRN

## 2016-08-09 NOTE — Progress Notes (Signed)
Pre visit review using our clinic review tool, if applicable. No additional management support is needed unless otherwise documented below in the visit note. 

## 2016-08-09 NOTE — Patient Instructions (Addendum)
NOW Probiotic Plain Mucinex 2 times a day Elderberry Aged Garlic ZINC  LOTS OF WATER!!! Vitamin C 500 to 1000 mg   Acute Bronchitis, Adult Acute bronchitis is when air tubes (bronchi) in the lungs suddenly get swollen. The condition can make it hard to breathe. It can also cause these symptoms:  A cough.  Coughing up clear, yellow, or green mucus.  Wheezing.  Chest congestion.  Shortness of breath.  A fever.  Body aches.  Chills.  A sore throat. Follow these instructions at home: Medicines  Take over-the-counter and prescription medicines only as told by your doctor.  If you were prescribed an antibiotic medicine, take it as told by your doctor. Do not stop taking the antibiotic even if you start to feel better. General instructions  Rest.  Drink enough fluids to keep your pee (urine) clear or pale yellow.  Avoid smoking and secondhand smoke. If you smoke and you need help quitting, ask your doctor. Quitting will help your lungs heal faster.  Use an inhaler, cool mist vaporizer, or humidifier as told by your doctor.  Keep all follow-up visits as told by your doctor. This is important. How is this prevented? To lower your risk of getting this condition again:  Wash your hands often with soap and water. If you cannot use soap and water, use hand sanitizer.  Avoid contact with people who have cold symptoms.  Try not to touch your hands to your mouth, nose, or eyes.  Make sure to get the flu shot every year. Contact a doctor if:  Your symptoms do not get better in 2 weeks. Get help right away if:  You cough up blood.  You have chest pain.  You have very bad shortness of breath.  You become dehydrated.  You faint (pass out) or keep feeling like you are going to pass out.  You keep throwing up (vomiting).  You have a very bad headache.  Your fever or chills gets worse. This information is not intended to replace advice given to you by your health  care provider. Make sure you discuss any questions you have with your health care provider. Document Released: 01/25/2008 Document Revised: 03/16/2016 Document Reviewed: 01/27/2016 Elsevier Interactive Patient Education  2017 [employer]vier Inc.  Otitis Media, Adult Otitis media is redness, soreness, and puffiness (swelling) in the space just behind your eardrum (middle ear). It may be caused by allergies or infection. It often happens along with a cold. Follow these instructions at home:  Take your medicine as told. Finish it even if you start to feel better.  Only take over-the-counter or prescription medicines for pain, discomfort, or fever as told by your doctor.  Follow up with your doctor as told. Contact a doctor if:  You have otitis media only in one ear, or bleeding from your nose, or both.  You notice a lump on your neck.  You are not getting better in 3-5 days.  You feel worse instead of better. Get help right away if:  You have pain that is not helped with medicine.  You have puffiness, redness, or pain around your ear.  You get a stiff neck.  You cannot move part of your face (paralysis).  You notice that the bone behind your ear hurts when you touch it. This information is not intended to replace advice given to you by your health care provider. Make sure you discuss any questions you have with your health care provider. Document Released: 01/25/2008 Document Revised:  01/14/2016 Document Reviewed: 03/05/2013 Elsevier Interactive Patient Education  2017 [employer]

## 2016-08-09 NOTE — Progress Notes (Signed)
Patient ID: Suzanne MaladyBonnie Lou Barber, female   DOB: 02-28-1956, 60 y.o.   MRN: [id number]   Subjective:    Patient ID: Suzanne MaladyBonnie Lou Silba, female    DOB: 02-28-1956, 60 y.o.   MRN: [id number]  Chief Complaint  Patient presents with  . URI    HPI Patient is in today for follow up and she is not feeling welll. She is having congestion, cough and malaise, her cough is productive of yellow phlegm. She is noting worsening neck pain b/l with radicular pain down b/l arms. No recent trauma or falls. Denies CP/palp/SOB/fevers/GI or GU c/o. Taking meds as prescribed  Past Medical History:  Diagnosis Date  . Acute bronchitis 08/24/2016  . ADD (attention deficit disorder)   . Depression   . Depression with anxiety   . Frequent headaches   . Hyperlipidemia, mixed 09/06/2015  . Hypertension   . Low back pain 04/11/2016  . OAB (overactive bladder)   . Preventative health care 09/06/2015  . Urine incontinence     Past Surgical History:  Procedure Laterality Date  . BREAST BIOPSY  2006    Family History  Problem Relation Age of Onset  . Diabetes Mother   . Colon cancer Mother   . Stroke Mother   . Hypertension Mother   . Colon cancer Sister   . Cancer Sister [id number]    colon  . Diabetes Sister   . Breast cancer Sister   . Cancer Sister     breast  . Breast cancer Maternal Grandmother   . Alcohol abuse Father     cirrhosis  . Diverticulitis Father   . Cancer Sister     cervix  . Heart disease Brother [id number]  . Mental illness Brother     schizophrenia    Social History   Social History  . Marital status: Married    Spouse name: N/A  . Number of children: N/A  . Years of education: N/A   Occupational History  . Not on file.   Social History Main Topics  . Smoking status: Former Smoker    Quit date: 05/14/1983  . Smokeless tobacco: Not on file  . Alcohol use Yes  . Drug use: No  . Sexual activity: Yes     Comment: married, [place] in [place], no major dietary restrictions. eats hi  protein minimizes sugar   Other Topics Concern  . Not on file   Social History Narrative  . No narrative on file    Outpatient Medications Prior to Visit  Medication Sig Dispense Refill  . ALPRAZolam (XANAX) 0.25 MG tablet Take 1-2 tablets (0.25-0.5 mg total) by mouth 2 (two) times daily as needed for anxiety. 70 tablet 1  . amphetamine-dextroamphetamine (ADDERALL) 5 MG tablet Take 1 tablet (5 mg total) by mouth 2 (two) times daily with a meal. october 2016 60 tablet 0  . aspirin 81 MG EC tablet Take 81 mg by mouth daily.    . Cholecalciferol (VITAMIN D) 2000 UNITS CAPS Take by mouth.    . hydrOXYzine (ATARAX/VISTARIL) 25 MG tablet Take 1 tablet (25 mg total) by mouth every 8 (eight) hours as needed for itching. 21 tablet 0  . ibuprofen (ADVIL,MOTRIN) 600 MG tablet Take 1 tablet (600 mg total) by mouth every 6 (six) hours as needed. 90 tablet 2  . Multiple Vitamin (MULTIVITAMIN) tablet Take 1 tablet by mouth daily.    . mupirocin cream (BACTROBAN) 2 % Apply 1 application topically 2 (two) times daily. 15 g 1  .  Omega-3 Fatty Acids (OMEGA 3 PO) Take by mouth 2 (two) times daily.    . pregabalin (LYRICA) 75 MG capsule Take 1-2 capsules (75-150 mg total) by mouth 3 (three) times daily. 120 capsule 3  . venlafaxine XR (EFFEXOR XR) 37.5 MG 24 hr capsule Take 1 capsule (37.5 mg total) by mouth daily with breakfast. 14 capsule 0  . venlafaxine XR (EFFEXOR XR) 75 MG 24 hr capsule Take 1 capsule (75 mg total) by mouth daily with breakfast. 30 capsule 2  . HYDROcodone-acetaminophen (NORCO) 10-325 MG tablet Take 1 tablet by mouth every 8 (eight) hours as needed. 60 tablet 0  . cetirizine (ZYRTEC) 10 MG tablet Take 10 mg by mouth daily. Reported on 12/08/2015     No facility-administered medications prior to visit.     Allergies  Allergen Reactions  . Shellfish Allergy Rash  . Penicillins Itching and Swelling  . Latex Rash    Review of Systems  Constitutional: Positive for malaise/fatigue.  Negative for fever.  HENT: Positive for congestion.   Eyes: Negative for blurred vision.  Respiratory: Positive for cough and sputum production. Negative for shortness of breath.   Cardiovascular: Negative for chest pain and palpitations.  Gastrointestinal: Negative for vomiting.  Musculoskeletal: Positive for neck pain. Negative for back pain.  Skin: Negative for rash.  Neurological: Negative for loss of consciousness and headaches.       Objective:    Physical Exam  Constitutional: She is oriented to person, place, and time. She appears well-developed and well-nourished. No distress.  HENT:  Head: Normocephalic and atraumatic.  TMs erythematous and dull  Eyes: Conjunctivae are normal.  Neck: Normal range of motion. No thyromegaly present.  Cardiovascular: Normal rate and regular rhythm.   Murmur heard. Pulmonary/Chest: Effort normal and breath sounds normal. She has no wheezes.  Abdominal: Soft. Bowel sounds are normal. There is no tenderness.  Musculoskeletal: Normal range of motion. She exhibits no edema or deformity.  Spasm in b/l SCM muscles.   Neurological: She is alert and oriented to person, place, and time.  Skin: Skin is warm and dry. She is not diaphoretic.  Psychiatric: She has a normal mood and affect.    BP 110/78 (BP Location: Left Arm, Patient Position: Sitting, Cuff Size: Normal)   Pulse (!) 56   Temp 98.1 F (36.7 C) (Oral)   Wt 144 lb 6.4 oz (65.5 kg)   SpO2 97%   BMI 25.58 kg/m  Wt Readings from Last 3 Encounters:  08/09/16 144 lb 6.4 oz (65.5 kg)  07/22/16 144 lb 8 oz (65.5 kg)  04/11/16 145 lb 6 oz (65.9 kg)     Lab Results  Component Value Date   WBC 5.9 08/27/2015   HGB 13.3 08/27/2015   HCT 42.0 08/27/2015   PLT 276.0 08/27/2015   GLUCOSE 83 04/11/2016   CHOL 237 (H) 04/11/2016   TRIG 163.0 (H) 04/11/2016   HDL 66.10 04/11/2016   LDLCALC 138 (H) 04/11/2016   ALT 14 04/11/2016   AST 16 04/11/2016   NA 140 04/11/2016   K 4.0  04/11/2016   CL 103 04/11/2016   CREATININE 0.82 04/11/2016   BUN 7 04/11/2016   CO2 30 04/11/2016   TSH 0.62 08/27/2015    Lab Results  Component Value Date   TSH 0.62 08/27/2015   Lab Results  Component Value Date   WBC 5.9 08/27/2015   HGB 13.3 08/27/2015   HCT 42.0 08/27/2015   MCV 85.4 08/27/2015   PLT  276.0 08/27/2015   Lab Results  Component Value Date   NA 140 04/11/2016   K 4.0 04/11/2016   CO2 30 04/11/2016   GLUCOSE 83 04/11/2016   BUN 7 04/11/2016   CREATININE 0.82 04/11/2016   BILITOT 0.3 04/11/2016   ALKPHOS 66 04/11/2016   AST 16 04/11/2016   ALT 14 04/11/2016   PROT 7.5 04/11/2016   ALBUMIN 4.3 04/11/2016   CALCIUM 9.3 04/11/2016   GFR 91.76 04/11/2016   Lab Results  Component Value Date   CHOL 237 (H) 04/11/2016   Lab Results  Component Value Date   HDL 66.10 04/11/2016   Lab Results  Component Value Date   LDLCALC 138 (H) 04/11/2016   Lab Results  Component Value Date   TRIG 163.0 (H) 04/11/2016   Lab Results  Component Value Date   CHOLHDL 4 04/11/2016   No results found for: HGBA1C    I acted as a Neurosurgeon for Dr. Abner Greenspan. Princess, RMA  Assessment & Plan:   Problem List Items Addressed This Visit    ADD (attention deficit disorder)    Adderal 5 mg as needed      Hypertension    Well controlled, no changes to meds. Encouraged heart healthy diet such as the DASH diet and exercise as tolerated.       Neck pain, bilateral - Primary    Encouraged moist heat and gentle stretching as tolerated. May try NSAIDs and prescription meds as directed and report if symptoms worsen or seek immediate care. Proceed with imaging, allowed pain meds to use prn      Relevant Medications   HYDROcodone-acetaminophen (NORCO) 10-325 MG tablet   Other Relevant Orders   MR Cervical Spine Wo Contrast   DG Cervical Spine Complete (Completed)   Hyperlipidemia, mixed    Encouraged heart healthy diet, increase exercise, avoid trans fats, consider a  krill oil cap daily      Acute bronchitis    Encouraged increased rest and hydration, add probiotics, zinc such as Coldeze or Xicam. Treat fevers as needed. Bactrim and mucinex          I am having Ms. Northway start on HYDROcodone-homatropine and sulfamethoxazole-trimethoprim. I am also having her maintain her aspirin, Vitamin D, multivitamin, Omega-3 Fatty Acids (OMEGA 3 PO), cetirizine, hydrOXYzine, amphetamine-dextroamphetamine, ibuprofen, mupirocin cream, ALPRAZolam, pregabalin, venlafaxine XR, venlafaxine XR, and HYDROcodone-acetaminophen.  Meds ordered this encounter  Medications  . HYDROcodone-acetaminophen (NORCO) 10-325 MG tablet    Sig: Take 1 tablet by mouth every 8 (eight) hours as needed.    Dispense:  60 tablet    Refill:  0  . HYDROcodone-homatropine (HYCODAN) 5-1.5 MG/5ML syrup    Sig: Take 5 mLs by mouth at bedtime as needed for cough.    Dispense:  150 mL    Refill:  0  . sulfamethoxazole-trimethoprim (BACTRIM DS,SEPTRA DS) 800-160 MG tablet    Sig: Take 1 tablet by mouth 2 (two) times daily.    Dispense:  20 tablet    Refill:  0     Danise Edge, MD

## 2016-08-24 ENCOUNTER — Encounter: Payer: Self-pay | Admitting: Family Medicine

## 2016-08-24 DIAGNOSIS — J209 Acute bronchitis, unspecified: Secondary | ICD-10-CM

## 2016-08-24 HISTORY — DX: Acute bronchitis, unspecified: J20.9

## 2016-08-24 NOTE — Assessment & Plan Note (Signed)
Well controlled, no changes to meds. Encouraged heart healthy diet such as the DASH diet and exercise as tolerated.  °

## 2016-08-24 NOTE — Assessment & Plan Note (Signed)
Encouraged heart healthy diet, increase exercise, avoid trans fats, consider a krill oil cap daily 

## 2016-08-24 NOTE — Assessment & Plan Note (Signed)
Encouraged increased rest and hydration, add probiotics, zinc such as Coldeze or Xicam. Treat fevers as needed. Bactrim and mucinex

## 2016-08-24 NOTE — Assessment & Plan Note (Signed)
Adderal 5 mg as needed

## 2016-08-24 NOTE — Assessment & Plan Note (Signed)
Encouraged moist heat and gentle stretching as tolerated. May try NSAIDs and prescription meds as directed and report if symptoms worsen or seek immediate care. Proceed with imaging, allowed pain meds to use prn

## 2016-09-15 ENCOUNTER — Encounter: Payer: Self-pay | Admitting: Internal Medicine

## 2016-09-15 ENCOUNTER — Ambulatory Visit (INDEPENDENT_AMBULATORY_CARE_PROVIDER_SITE_OTHER): Payer: Self-pay | Admitting: Internal Medicine

## 2016-09-15 VITALS — BP 124/76 | HR 79 | Temp 98.1°F | Resp 14 | Ht 63.0 in | Wt 144.5 lb

## 2016-09-15 DIAGNOSIS — H10022 Other mucopurulent conjunctivitis, left eye: Secondary | ICD-10-CM

## 2016-09-15 DIAGNOSIS — M542 Cervicalgia: Secondary | ICD-10-CM

## 2016-09-15 MED ORDER — GENTAMICIN SULFATE 0.3 % OP SOLN: 2.0000 [drp] | OPHTHALMIC | 0 refills | Status: DC

## 2016-09-15 MED ORDER — HYDROCODONE-ACETAMINOPHEN 10-325 MG PO TABS: 1.0000 | ORAL_TABLET | Freq: Three times a day (TID) | ORAL | 0 refills | Status: DC | PRN

## 2016-09-15 MED ORDER — ALPRAZOLAM 0.25 MG PO TABS: 0.2500 mg | ORAL_TABLET | Freq: Two times a day (BID) | ORAL | 0 refills | Status: DC | PRN

## 2016-09-15 MED FILL — GENTAMICIN 3 MG/ML EYE DRP: 0.3 | 9 days supply | Qty: 5 | Fill #0

## 2016-09-15 NOTE — Progress Notes (Signed)
Subjective:    Patient ID: Suzanne Barber, female    DOB: 1955-10-14, 61 y.o.   MRN: [id number]  DOS:  09/15/2016 Type of visit - description : Acute visit Interval history: Symptoms started 4 days ago at the left eye, she noticed some dry crust in the mornings, since then, eye become red and watery. No other people affected by similar sx in her enviroment. Does not wear contact lenses Had a URI a few weeks ago, symptoms largely gone. Anxiety: Needs a refill on Xanax Pain management-- request a rf on hydrocodone   Review of Systems Some itching and pain at the left eye. Mild right intolerance  Past Medical History:  Diagnosis Date  . Acute bronchitis 08/24/2016  . ADD (attention deficit disorder)   . Depression   . Depression with anxiety   . Frequent headaches   . Hyperlipidemia, mixed 09/06/2015  . Hypertension   . Low back pain 04/11/2016  . OAB (overactive bladder)   . Preventative health care 09/06/2015  . Urine incontinence     Past Surgical History:  Procedure Laterality Date  . BREAST BIOPSY  2006    Social History   Social History  . Marital status: Married    Spouse name: N/A  . Number of children: N/A  . Years of education: N/A   Occupational History  . Not on file.   Social History Main Topics  . Smoking status: Former Smoker    Quit date: 05/14/1983  . Smokeless tobacco: Never Used  . Alcohol use Yes  . Drug use: No  . Sexual activity: Yes     Comment: married, husband in [place], no major dietary restrictions. eats hi protein minimizes sugar   Other Topics Concern  . Not on file   Social History Narrative  . No narrative on file      Allergies as of 09/15/2016      Reactions   Shellfish Allergy Rash   Penicillins Itching, Swelling   Latex Rash      Medication List       Accurate as of 09/15/16  2:10 PM. Always use your most recent med list.          ALPRAZolam 0.25 MG tablet Commonly known as:  XANAX Take 1-2 tablets  (0.25-0.5 mg total) by mouth 2 (two) times daily as needed for anxiety.   amphetamine-dextroamphetamine 5 MG tablet Commonly known as:  ADDERALL Take 1 tablet (5 mg total) by mouth 2 (two) times daily with a meal. october 2016   aspirin 81 MG EC tablet Take 81 mg by mouth daily.   cetirizine 10 MG tablet Commonly known as:  ZYRTEC Take 10 mg by mouth daily. Reported on 12/08/2015   HYDROcodone-acetaminophen 10-325 MG tablet Commonly known as:  NORCO Take 1 tablet by mouth every 8 (eight) hours as needed.   hydrOXYzine 25 MG tablet Commonly known as:  ATARAX/VISTARIL Take 1 tablet (25 mg total) by mouth every 8 (eight) hours as needed for itching.   ibuprofen 600 MG tablet Commonly known as:  ADVIL,MOTRIN Take 1 tablet (600 mg total) by mouth every 6 (six) hours as needed.   multivitamin tablet Take 1 tablet by mouth daily.   mupirocin cream 2 % Commonly known as:  BACTROBAN Apply 1 application topically 2 (two) times daily.   OMEGA 3 PO Take by mouth 2 (two) times daily.   pregabalin 75 MG capsule Commonly known as:  LYRICA Take 1-2 capsules (75-150 mg total) by  mouth 3 (three) times daily.   venlafaxine XR 37.5 MG 24 hr capsule Commonly known as:  EFFEXOR XR Take 1 capsule (37.5 mg total) by mouth daily with breakfast.   venlafaxine XR 75 MG 24 hr capsule Commonly known as:  EFFEXOR XR Take 1 capsule (75 mg total) by mouth daily with breakfast.   Vitamin D 2000 units Caps Take by mouth.          Objective:   Physical Exam BP 124/76 (BP Location: Left Arm, Patient Position: Sitting, Cuff Size: Small)   Pulse 79   Temp 98.1 F (36.7 C) (Oral)   Resp 14   Ht 5\' 3"  (1.6 m)   Wt 144 lb 8 oz (65.5 kg)   SpO2 98%   BMI 25.60 kg/m  General:   Well developed, well nourished . NAD.  HEENT:  Normocephalic . Face symmetric, atraumatic EOMI. Pupils equal and reactive. Anterior chambers normal. Fluorescein check, left eye: No FBs or corneal  scratches. Neurologic:  alert & oriented X3.  Speech normal, gait appropriate for age and unassisted Psych--  Cognition and judgment appear intact.  Cooperative with normal attention span and concentration.  Behavior appropriate. No anxious or depressed appearing.      Assessment & Plan:   61 year old lady with history of depression, anxiety, frequent headaches, high cholesterol, HTN, low back pain, overactive bladder, ADD presents with the following: Left conjunctivitis: Exam benign, recommend cold compresses, artificial tears, antibiotic  drops. Call if not gradually better Anxiety: Refill Xanax Pain management: Refill hydrocodone which is refill consistently by PCP.

## 2016-09-15 NOTE — Progress Notes (Signed)
Pre visit review using our clinic review tool, if applicable. No additional management support is needed unless otherwise documented below in the visit note. 

## 2016-09-15 NOTE — Patient Instructions (Signed)
Cold compresses as needed for comfort   Use the antibiotic drops for 5 days  Okay also to use artificial tears as needed  Call if not gradually better, call if severe symptoms, problems with your visual acuity

## 2016-10-04 ENCOUNTER — Ambulatory Visit: Payer: Managed Care, Other (non HMO) | Admitting: Family Medicine

## 2016-11-04 ENCOUNTER — Ambulatory Visit (INDEPENDENT_AMBULATORY_CARE_PROVIDER_SITE_OTHER): Payer: BLUE CROSS/BLUE SHIELD | Admitting: Medical

## 2016-11-04 ENCOUNTER — Telehealth: Payer: Self-pay | Admitting: *Deleted

## 2016-11-04 ENCOUNTER — Encounter: Payer: Self-pay | Admitting: Medical

## 2016-11-04 ENCOUNTER — Encounter: Payer: Self-pay | Admitting: *Deleted

## 2016-11-04 ENCOUNTER — Encounter: Payer: Self-pay | Admitting: Family Medicine

## 2016-11-04 VITALS — BP 120/74 | HR 54 | Temp 98.3°F | Resp 16 | Ht 63.0 in | Wt 138.5 lb

## 2016-11-04 DIAGNOSIS — H1032 Unspecified acute conjunctivitis, left eye: Secondary | ICD-10-CM

## 2016-11-04 DIAGNOSIS — R350 Frequency of micturition: Secondary | ICD-10-CM

## 2016-11-04 DIAGNOSIS — N3281 Overactive bladder: Secondary | ICD-10-CM

## 2016-11-04 DIAGNOSIS — N950 Postmenopausal bleeding: Secondary | ICD-10-CM

## 2016-11-04 MED ORDER — OXYBUTYNIN CHLORIDE 5 MG PO TABS: 5.0000 mg | ORAL_TABLET | Freq: Every day | ORAL | 2 refills | Status: DC | PRN

## 2016-11-04 MED ORDER — CITALOPRAM HYDROBROMIDE 40 MG PO TABS: 40.0000 mg | ORAL_TABLET | Freq: Every day | ORAL | 3 refills | Status: DC

## 2016-11-04 MED ORDER — TOBRAMYCIN 0.3 % OP SOLN: 2.0000 [drp] | Freq: Four times a day (QID) | OPHTHALMIC | 0 refills | Status: DC

## 2016-11-04 MED FILL — OXYBUTYNIN 5 MG TABLET: 5 | 30 days supply | Qty: 30 | Fill #0

## 2016-11-04 MED FILL — TOBRAMYCIN 0.3% EYE DROPS: 0.3 | 13 days supply | Qty: 5 | Fill #0

## 2016-11-04 NOTE — Progress Notes (Signed)
Pre visit review using our clinic review tool, if applicable. No additional management support is needed unless otherwise documented below in the visit note/SLS  

## 2016-11-04 NOTE — Progress Notes (Signed)
Subjective:    Patient ID: Suzanne Barber, female    DOB: [birth year], 61 y.o.   MRN: [id number]  HPI  Pt in with some left eye faint discomfort. Mild irritation last 2 days. Pt in January had used gentamycin and it helped her eyes but never felt better completley. In January had thick eye dc. This time last 2 mild eye dc medial aspect/canthus area dc in am per pt.   No eye trauma. Vision is ok. No eye pressure. No floater or flashing light appearance.  Pt 3 weeks ago had frequent urination. Was worse then but now has lessened.   Pt in today reporting urinary symptoms.  Dysuria- no Frequent urination-yes(but a lot better now was worse 3 weeks ago) Hesitancy-no Suprapubic pressure-yes(faint) Fever-no chills-no Nausea-no Vomiting-no CVA pain-no History of UTI- Gross hematuria-no   Pt also thinks she had spotting 3 weeks ago.(pt went through menopause age 26 years)  Review of Systems  Constitutional: Negative for chills, fatigue and fever.  Eyes: Positive for discharge and redness. Negative for visual disturbance.  Respiratory: Negative for cough, chest tightness, shortness of breath and wheezing.   Cardiovascular: Negative for chest pain and palpitations.  Genitourinary: Positive for frequency. Negative for decreased urine volume, difficulty urinating, dysuria, urgency and vaginal pain.       3 weeks ago some vaginal spotting.  Musculoskeletal: Negative for back pain, joint swelling, myalgias and neck stiffness.  Skin: Negative for rash.  Neurological: Negative for dizziness and light-headedness.  Hematological: Negative for adenopathy. Does not bruise/bleed easily.  Psychiatric/Behavioral: Negative for behavioral problems and confusion.    Past Medical History:  Diagnosis Date  . Acute bronchitis 08/24/2016  . ADD (attention deficit disorder)   . Depression   . Depression with anxiety   . Frequent headaches   . Hyperlipidemia, mixed 09/06/2015  . Hypertension   .  Low back pain 04/11/2016  . OAB (overactive bladder)   . Preventative health care 09/06/2015  . Urine incontinence      Social History   Social History  . Marital status: Married    Spouse name: N/A  . Number of children: N/A  . Years of education: N/A   Occupational History  . Not on file.   Social History Main Topics  . Smoking status: Former Smoker    Quit date: 05/14/1983  . Smokeless tobacco: Never Used  . Alcohol use Yes  . Drug use: No  . Sexual activity: Yes     Comment: married, husband in [place], no major dietary restrictions. eats hi protein minimizes sugar   Other Topics Concern  . Not on file   Social History Narrative  . No narrative on file    Past Surgical History:  Procedure Laterality Date  . BREAST BIOPSY  2006    Family History  Problem Relation Age of Onset  . Diabetes Mother   . Colon cancer Mother   . Stroke Mother   . Hypertension Mother   . Colon cancer Sister   . Cancer Sister 59    colon  . Diabetes Sister   . Breast cancer Sister   . Cancer Sister     breast  . Breast cancer Maternal Grandmother   . Alcohol abuse Father     cirrhosis  . Diverticulitis Father   . Cancer Sister     cervix  . Heart disease Brother 63  . Mental illness Brother     schizophrenia    Allergies  Allergen Reactions  .  Shellfish Allergy Rash  . Penicillins Itching and Swelling  . Latex Rash    Current Outpatient Prescriptions on File Prior to Visit  Medication Sig Dispense Refill  . ALPRAZolam (XANAX) 0.25 MG tablet Take 1-2 tablets (0.25-0.5 mg total) by mouth 2 (two) times daily as needed for anxiety. 40 tablet 0  . amphetamine-dextroamphetamine (ADDERALL) 5 MG tablet Take 1 tablet (5 mg total) by mouth 2 (two) times daily with a meal. october 2016 60 tablet 0  . aspirin 81 MG EC tablet Take 81 mg by mouth daily.    . Cholecalciferol (VITAMIN D) 2000 UNITS CAPS Take by mouth.    Marland Kitchen HYDROcodone-acetaminophen (NORCO) 10-325 MG tablet Take 1  tablet by mouth every 8 (eight) hours as needed. 30 tablet 0  . hydrOXYzine (ATARAX/VISTARIL) 25 MG tablet Take 1 tablet (25 mg total) by mouth every 8 (eight) hours as needed for itching. 21 tablet 0  . ibuprofen (ADVIL,MOTRIN) 600 MG tablet Take 1 tablet (600 mg total) by mouth every 6 (six) hours as needed. 90 tablet 2  . Multiple Vitamin (MULTIVITAMIN) tablet Take 1 tablet by mouth daily.    . mupirocin cream (BACTROBAN) 2 % Apply 1 application topically 2 (two) times daily. 15 g 1  . Omega-3 Fatty Acids (OMEGA 3 PO) Take by mouth 2 (two) times daily.    . pregabalin (LYRICA) 75 MG capsule Take 1-2 capsules (75-150 mg total) by mouth 3 (three) times daily. 120 capsule 3  . venlafaxine XR (EFFEXOR XR) 37.5 MG 24 hr capsule Take 1 capsule (37.5 mg total) by mouth daily with breakfast. 14 capsule 0  . venlafaxine XR (EFFEXOR XR) 75 MG 24 hr capsule Take 1 capsule (75 mg total) by mouth daily with breakfast. 30 capsule 2  . cetirizine (ZYRTEC) 10 MG tablet Take 10 mg by mouth daily. Reported on 12/08/2015    . gentamicin (GARAMYCIN) 0.3 % ophthalmic solution Place 2 drops into the left eye every 4 (four) hours. (Patient not taking: Reported on 11/04/2016) 5 mL 0   No current facility-administered medications on file prior to visit.     BP 120/74 (BP Location: Left Arm, Patient Position: Sitting, Cuff Size: Normal)   Pulse (!) 54   Temp 98.3 F (36.8 C) (Oral)   Resp 16   Ht 5\' 3"  (1.6 m)   Wt 138 lb 8 oz (62.8 kg)   SpO2 100%   BMI 24.53 kg/m       Objective:   Physical Exam  General  Mental Status - Alert. General Appearance - Well groomed. Not in acute distress.  Skin Rashes- No Rashes.  HEENT Head- Normal. Ear Auditory Canal - Left- Normal. Right - Normal.Tympanic Membrane- Left- Normal. Right- Normal. Eye Sclera/Conjunctiva- Left- faint injected conjunctiva(faint yellow dry dc medial canthus) Right- Normal. Nose & Sinuses Nasal Mucosa- Left-  Boggy and Congested. Right-   Boggy and  Congested.Bilateral maxillary and frontal sinus pressure. Mouth & Throat Lips: Upper Lip- Normal: no dryness, cracking, pallor, cyanosis, or vesicular eruption. Lower Lip-Normal: no dryness, cracking, pallor, cyanosis or vesicular eruption. Buccal Mucosa- Bilateral- No Aphthous ulcers. Oropharynx- No Discharge or Erythema. Tonsils: Characteristics- Bilateral- No Erythema or Congestion. Size/Enlargement- Bilateral- No enlargement. Discharge- bilateral-None.  Neck Neck- Supple. No Masses.   Chest and Lung Exam Auscultation: Breath Sounds:-Clear even and unlabored.  Cardiovascular Auscultation:Rythm- Regular, rate and rhythm. Murmurs & Other Heart Sounds:Ausculatation of the heart reveal- No Murmurs.  Lymphatic Head & Neck General Head & Neck Lymphatics: Bilateral: Description-  No Localized lymphadenopathy.    Abdomen Inspection:-Inspection Normal.  Palpation/Perucssion: Palpation and Percussion of the abdomen reveal- faint suprapubic Tender, No Rebound tenderness, No rigidity(Guarding) and No Palpable abdominal masses.  Liver:-Normal.  Spleen:- Normal.   Back- no cva tenderness      Assessment & Plan:  For frequent urination will get ua and culture. Will follow culture and decide if you need antibiotic.  For conjunctivitis rx tobrex.  For overactive bladder rx ditropan.  For post menopause bleeding refer to gyn.  Follow up in 7-10 days or rx.

## 2016-11-04 NOTE — Patient Instructions (Signed)
For frequent urination will get ua and culture. Will follow culture and decide if you need antibiotic.  For conjunctivitis rx tobrex.  For overactive bladder rx ditropan.  For post menopause bleeding refer to gyn.  Follow up in 7-10 days or rx.

## 2016-11-04 NOTE — Addendum Note (Signed)
Addended by: Cammy CopaHANDLER, Loghan Subia N on: 11/04/2016 02:04 PM   Modules accepted: Orders

## 2016-11-04 NOTE — Telephone Encounter (Signed)
Spoke with Dr. Abner GreenspanBlyth about this matter and she stated to inform patient that she would need to complete CSC & UDS prior to receiving control meds, and then she could receive enough to get her through until she [schedules and completes] a F/U visit within the next 30-days; Patient informed, understood & agreed. Patient did want PCP to know that she hasn't had some of medications lately and that [facility], as she has been in [place] and smoked some marajuana/SLS 03/16

## 2016-11-04 NOTE — Telephone Encounter (Signed)
I am not sure about patient situation, but she is in today to see Ramon Dredgedward for "bladder problem", and is more concerned with & wanting us to refill her Controlled medications: Alprazolam 0.25mg , [added back to list as it was d/c for refill] Citalopram 40mg , Adderall 5mg , Hydrocodone-APAP 10-325mg , Venlafaxine 37.5mg  & 75mg . I have explained to patient that she has to request her control meds from her PCP [she received some from Dr. Drue NovelPaz when seen for Acute visit 09/15/16]. Patient was to schedule F/U in [2] months when seen by PCP in December but cancelled February appointment and still has not schedule appointment with PCP; she has No CSC and/or UDS on file; so again I have explained to her that a message would be sent to her PCP for response, but I could not give her any assurance that it would be today/SLS 03/16

## 2016-11-04 NOTE — Telephone Encounter (Signed)
If she does contract and UDS today once results available I can cover her for a month or so til she can see me but she needs an appt with me to document need with new guidelinesr

## 2016-11-06 NOTE — Telephone Encounter (Signed)
Will see patient in follow up to continue her current meds.

## 2016-11-07 LAB — POC URINALSYSI DIPSTICK (AUTOMATED)
Bilirubin, UA: NEGATIVE
Glucose, UA: NEGATIVE
Ketones, UA: NEGATIVE
Nitrite, UA: NEGATIVE
PH UA: 6 (ref 5.0–8.0)
PROTEIN UA: NEGATIVE
SPEC GRAV UA: 1.02 (ref 1.030–1.035)
UROBILINOGEN UA: 0.2 (ref ?–2.0)

## 2016-11-09 LAB — URINE CULTURE

## 2016-11-09 MED ORDER — CIPROFLOXACIN HCL 500 MG PO TABS: 500.0000 mg | ORAL_TABLET | Freq: Two times a day (BID) | ORAL | 0 refills | Status: DC

## 2016-11-09 NOTE — Telephone Encounter (Signed)
rx cipro sent to pt pharmacy. 

## 2016-11-10 ENCOUNTER — Other Ambulatory Visit: Payer: Self-pay | Admitting: Emergency Medicine

## 2016-11-10 DIAGNOSIS — M542 Cervicalgia: Secondary | ICD-10-CM

## 2016-11-10 DIAGNOSIS — F988 Other specified behavioral and emotional disorders with onset usually occurring in childhood and adolescence: Secondary | ICD-10-CM

## 2016-11-10 MED ORDER — PREGABALIN 75 MG PO CAPS: 75.0000 mg | ORAL_CAPSULE | Freq: Three times a day (TID) | ORAL | 2 refills | Status: DC

## 2016-11-10 MED ORDER — VENLAFAXINE HCL ER 75 MG PO CP24: 75.0000 mg | ORAL_CAPSULE | Freq: Every day | ORAL | 2 refills | Status: DC

## 2016-11-10 MED ORDER — CIPROFLOXACIN HCL 500 MG PO TABS: 500.0000 mg | ORAL_TABLET | Freq: Two times a day (BID) | ORAL | 0 refills | Status: DC

## 2016-11-10 NOTE — Telephone Encounter (Signed)
Spoke to pt who states that she was suppose to have several refills sent to [facility]. Last office visit 08/09/16.  Contract: No  UDS : 11/04/16 Last OV: 08/09/16   REQUESTING: Amphetamine-dextroamphetamin (ADDERALL) 5 MG tablet Last filled 05/07/2015 HYDROcodone-acetaminophen (NORCO) 10-325 MG tablet Last filled 09/15/2016 ALPRAZolam (XANAX) 0.25 MG tablet  Last filled 09/15/2016 pregabalin (LYRICA) 75 MG capsule Last filled 07/22/2016  Venlafaxine XR (EFFEXOR XR) 75 MG 24 hr capsule (&) venlafaxine XR (EFFEXOR XR) 37.5 MG 24 hr capsule                                      (Pt states that she takes both 75 mg and 37.5 mg) Last filled 07/22/16

## 2016-11-10 NOTE — Telephone Encounter (Signed)
She can have a refill on the Venlafaxine 75 with 2 further refills, d/c the 37.5 mg dose, also refill the Lyrica with same amount. The Alprazolam, hydrocodone and Adderall have to wait til she passes a drug screen, she was positive for THC. She will have to retest in 2 months and come in for visit then we can discuss

## 2016-11-10 NOTE — Telephone Encounter (Signed)
Patient informed of PCP instructions. Informed of 2 prescriptions sent in. She did verbalize understanding and will mark her calendar for 2 months to be retested. Test result put in folder in lab for Amy to document.

## 2016-11-28 ENCOUNTER — Encounter: Payer: Self-pay | Admitting: Obstetrics & Gynecology

## 2016-11-28 ENCOUNTER — Ambulatory Visit (INDEPENDENT_AMBULATORY_CARE_PROVIDER_SITE_OTHER): Payer: BLUE CROSS/BLUE SHIELD | Admitting: Obstetrics & Gynecology

## 2016-11-28 VITALS — BP 130/92 | Ht 63.0 in | Wt 139.0 lb

## 2016-11-28 DIAGNOSIS — R102 Pelvic and perineal pain: Secondary | ICD-10-CM | POA: Diagnosis not present

## 2016-11-28 DIAGNOSIS — N950 Postmenopausal bleeding: Secondary | ICD-10-CM | POA: Diagnosis not present

## 2016-11-28 LAB — URINALYSIS W MICROSCOPIC + REFLEX CULTURE
BILIRUBIN URINE: NEGATIVE
CRYSTALS: NONE SEEN [HPF]
Casts: NONE SEEN [LPF]
GLUCOSE, UA: NEGATIVE
Ketones, ur: NEGATIVE
LEUKOCYTES UA: NEGATIVE
NITRITE: NEGATIVE
PH: 6 (ref 5.0–8.0)
PROTEIN: NEGATIVE
SPECIFIC GRAVITY, URINE: 1.025 (ref 1.001–1.035)
Yeast: NONE SEEN [HPF]

## 2016-11-28 NOTE — Patient Instructions (Signed)
Postmenopausal bleeding and pelvic pain.  Normal gynecologic exam today.  U/A pending.  Will f/u for pelvic US/possible Endometrial biopsy and Annual exam with pap test.

## 2016-11-28 NOTE — Progress Notes (Signed)
    Suzanne Barber [facility] 12-08-1955 [id number]        61 y.o.  Married, 3 children.  Menopause x >5 yrs per patient.  No HRT.  Presenting for PMB x 1 a few weeks ago.  C/O pelvic discomfort currently, midline, but no longer bleeding.  Had a Cystitis treated with Cipro 1 mth ago.  No frequency, no pain with miction currently.  No fever.  Past medical history,surgical history, problem list, medications, allergies, family history and social history were all reviewed and documented in the EPIC chart.  Directed ROS with pertinent positives and negatives documented in the history of present illness/assessment and plan.  Exam:  Vitals:   11/28/16 0914  BP: (!) 130/92  Weight: 139 lb (63 kg)  Height:  (1.6 m)   General appearance:  Normal Abdo soft, non-tender, no distention Gyn exam:  Uterus AV normal volume, no adnexal mass, no tenderness. Speculum: Cervix normal, no polyp.  No vaginal lesion. External genitalia wnl.  Assessment/Plan:  61 y.o.  1. Postmenopausal bleeding PMB with pelvic discomfort.  No HRT.  Normal Gyn exam today.  Will f/u with a pelvic US/possible EBX.  Counseling done of possible causes of PMB including Endometrial Ca, Hyperplasia, Polyps, Fibroids, Rectal and Bladder origins.  Investigation discussed.  Counseling >50% 25 min.  - US Pelvis Complete; Future  2. Female pelvic pain  -U/A showing Blood 2+.  Otherwise neg.  U. Culture pending.  If U. Culture neg, will refer to Urology to complete the investigation.    Will do Aex with Pap at f/u with Pelvic US.   Genia Del MD, 9:22 AM 11/28/2016

## 2016-11-28 NOTE — Addendum Note (Signed)
Addended by: Berna Spare A on: 11/28/2016 11:48 AM   Modules accepted: Orders

## 2016-11-29 LAB — URINE CULTURE

## 2016-12-05 ENCOUNTER — Ambulatory Visit: Payer: BLUE CROSS/BLUE SHIELD | Admitting: Obstetrics & Gynecology

## 2016-12-05 ENCOUNTER — Other Ambulatory Visit: Payer: BLUE CROSS/BLUE SHIELD

## 2016-12-12 ENCOUNTER — Other Ambulatory Visit: Payer: BLUE CROSS/BLUE SHIELD

## 2016-12-12 ENCOUNTER — Encounter: Payer: BLUE CROSS/BLUE SHIELD | Admitting: Obstetrics & Gynecology

## 2017-02-14 ENCOUNTER — Telehealth: Payer: Self-pay | Admitting: Family Medicine

## 2017-02-14 DIAGNOSIS — M542 Cervicalgia: Secondary | ICD-10-CM
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
## 2017-02-14 NOTE — Telephone Encounter (Signed)
Requesting:Norco Contract: No contract UDS: Given 11/04/16  HIGH RISK Last OV:11/04/16 Last Refill: 09/15/2016  Please Advise

## 2017-02-14 NOTE — Telephone Encounter (Signed)
Can have 30 day supply of the hydrocodone but she has to update UDS and do the contract. She will also need an appt for any further refills

## 2017-02-15 ENCOUNTER — Encounter: Payer: Self-pay | Admitting: Family Medicine

## 2017-02-16 MED ORDER — HYDROCODONE-ACETAMINOPHEN 10-325 MG PO TABS: 1.0000 | ORAL_TABLET | Freq: Three times a day (TID) | ORAL | 0 refills | Status: DC | PRN

## 2017-02-16 NOTE — Telephone Encounter (Signed)
Rx, signed and left in the front for pt to pick up. She is aware of the UDS and contract.   PC

## 2017-02-17 ENCOUNTER — Telehealth: Payer: Self-pay

## 2017-02-17 NOTE — Telephone Encounter (Signed)
PA initiated via Covermymeds; KEY: [id number]. Awaiting determination.

## 2017-02-17 NOTE — Telephone Encounter (Signed)
Received PA approval through 02/17/2018.

## 2017-02-21 ENCOUNTER — Ambulatory Visit (INDEPENDENT_AMBULATORY_CARE_PROVIDER_SITE_OTHER): Payer: BLUE CROSS/BLUE SHIELD | Admitting: Family Medicine

## 2017-02-21 ENCOUNTER — Encounter: Payer: Self-pay | Admitting: Family Medicine

## 2017-02-21 VITALS — BP 110/72 | HR 57 | Temp 98.2°F | Resp 18 | Wt 142.2 lb

## 2017-02-21 DIAGNOSIS — F418 Other specified anxiety disorders: Secondary | ICD-10-CM

## 2017-02-21 DIAGNOSIS — F329 Major depressive disorder, single episode, unspecified: Secondary | ICD-10-CM | POA: Diagnosis not present

## 2017-02-21 DIAGNOSIS — F32A Depression, unspecified: Secondary | ICD-10-CM

## 2017-02-21 DIAGNOSIS — M542 Cervicalgia: Secondary | ICD-10-CM

## 2017-02-21 DIAGNOSIS — H1010 Acute atopic conjunctivitis, unspecified eye: Secondary | ICD-10-CM | POA: Diagnosis not present

## 2017-02-21 DIAGNOSIS — E782 Mixed hyperlipidemia: Secondary | ICD-10-CM | POA: Diagnosis not present

## 2017-02-21 DIAGNOSIS — I10 Essential (primary) hypertension: Secondary | ICD-10-CM | POA: Diagnosis not present

## 2017-02-21 DIAGNOSIS — M545 Low back pain: Secondary | ICD-10-CM | POA: Diagnosis not present

## 2017-02-21 MED ORDER — HYDROCODONE-ACETAMINOPHEN 10-325 MG PO TABS: 1.0000 | ORAL_TABLET | Freq: Three times a day (TID) | ORAL | 0 refills | Status: DC | PRN

## 2017-02-21 MED ORDER — TOBRAMYCIN 0.3 % OP SOLN: 2.0000 [drp] | Freq: Four times a day (QID) | OPHTHALMIC | 0 refills | Status: DC

## 2017-02-21 MED ORDER — FLUTICASONE PROPIONATE 50 MCG/ACT NA SUSP: 2.0000 | Freq: Every day | NASAL | 2 refills | Status: AC

## 2017-02-21 MED ORDER — TIZANIDINE HCL 4 MG PO TABS: 4.0000 mg | ORAL_TABLET | Freq: Four times a day (QID) | ORAL | 0 refills | Status: DC | PRN

## 2017-02-21 NOTE — Progress Notes (Signed)
Subjective:  I acted as a Neurosurgeonscribe for Dr. Abner GreenspanBlyth. Suzanne Barber, Suzanne Barber  Patient ID: Suzanne Barber, female    DOB: [birth year], 61 y.o.   MRN: [id number]  No chief complaint on file.   HPI  Patient is in today for a follow up. Patient c/o left eye pink eye" No recent febrile illness or acute hospitalizations. Denies CP/palp/SOB/HA/fevers/GI or GU c/o. Taking meds as prescribed. She notes some eye drops have been helpful for her eyes somewhat. Symptoms have recurred. Eyes are red, itchy and runny. Nasal congestion also noted. No recent febrile illness or acute concerns. .continues to struggle with low back pain. No falls or trauma.    Patient Care Team: Bradd CanaryBlyth, Lizbet Cirrincione A, MD as PCP - General (Family Medicine)   Past Medical History:  Diagnosis Date  . Acute bronchitis 08/24/2016  . ADD (attention deficit disorder)   . Allergic conjunctivitis 02/22/2017  . Depression   . Depression with anxiety   . Frequent headaches   . Hyperlipidemia, mixed 09/06/2015  . Hypertension   . Low back pain 04/11/2016  . OAB (overactive bladder)   . Preventative health care 09/06/2015  . Urine incontinence     Past Surgical History:  Procedure Laterality Date  . BREAST BIOPSY  2006    Family History  Problem Relation Age of Onset  . Diabetes Mother   . Colon cancer Mother   . Stroke Mother   . Hypertension Mother   . Colon cancer Sister   . Cancer Sister [id number]       colon  . Diabetes Sister   . Breast cancer Sister   . Cancer Sister        breast  . Breast cancer Maternal Grandmother   . Alcohol abuse Father        cirrhosis  . Diverticulitis Father   . Cancer Sister        cervix  . Heart disease Brother [id number]  . Mental illness Brother        schizophrenia    Social History   Social History  . Marital status: Married    Spouse name: N/A  . Number of children: N/A  . Years of education: N/A   Occupational History  . Not on file.   Social History Main Topics  . Smoking status: Former  Smoker    Quit date: 05/14/1983  . Smokeless tobacco: Never Used  . Alcohol use Yes  . Drug use: No  . Sexual activity: Yes     Comment: married, [place] in [place], no major dietary restrictions. eats hi protein minimizes sugar   Other Topics Concern  . Not on file   Social History Narrative  . No narrative on file    Outpatient Medications Prior to Visit  Medication Sig Dispense Refill  . ALPRAZolam (XANAX) 0.25 MG tablet Take 1-2 tablets (0.25-0.5 mg total) by mouth 2 (two) times daily as needed for anxiety. 40 tablet 0  . amphetamine-dextroamphetamine (ADDERALL) 5 MG tablet Take 1 tablet (5 mg total) by mouth 2 (two) times daily with a meal. october 2016 60 tablet 0  . aspirin 81 MG EC tablet Take 81 mg by mouth daily.    . cetirizine (ZYRTEC) 10 MG tablet Take 10 mg by mouth daily. Reported on 12/08/2015    . Cholecalciferol (VITAMIN D) 2000 UNITS CAPS Take by mouth.    . citalopram (CELEXA) 40 MG tablet Take 1 tablet (40 mg total) by mouth daily. 30 tablet 3  .  hydrOXYzine (ATARAX/VISTARIL) 25 MG tablet Take 1 tablet (25 mg total) by mouth every 8 (eight) hours as needed for itching. 21 tablet 0  . ibuprofen (ADVIL,MOTRIN) 600 MG tablet Take 1 tablet (600 mg total) by mouth every 6 (six) hours as needed. 90 tablet 2  . Multiple Vitamin (MULTIVITAMIN) tablet Take 1 tablet by mouth daily.    . mupirocin cream (BACTROBAN) 2 % Apply 1 application topically 2 (two) times daily. 15 g 1  . Omega-3 Fatty Acids (OMEGA 3 PO) Take by mouth 2 (two) times daily.    Marland Kitchen oxybutynin (DITROPAN) 5 MG tablet Take 1 tablet (5 mg total) by mouth daily as needed for bladder spasms. 30 tablet 2  . pregabalin (LYRICA) 75 MG capsule Take 1-2 capsules (75-150 mg total) by mouth 3 (three) times daily. 120 capsule 2  . venlafaxine XR (EFFEXOR XR) 75 MG 24 hr capsule Take 1 capsule (75 mg total) by mouth daily with breakfast. 30 capsule 2  . HYDROcodone-acetaminophen (NORCO) 10-325 MG tablet Take 1 tablet by  mouth every 8 (eight) hours as needed. 30 tablet 0  . tobramycin (TOBREX) 0.3 % ophthalmic solution Place 2 drops into the left eye every 6 (six) hours. 5 mL 0  . ciprofloxacin (CIPRO) 500 MG tablet Take 1 tablet (500 mg total) by mouth 2 (two) times daily. (Patient not taking: Reported on 11/28/2016) 14 tablet 0  . gentamicin (GARAMYCIN) 0.3 % ophthalmic solution Place 2 drops into the left eye every 4 (four) hours. (Patient not taking: Reported on 11/28/2016) 5 mL 0   No facility-administered medications prior to visit.     Allergies  Allergen Reactions  . Shellfish Allergy Rash  . Penicillins Itching and Swelling  . Latex Rash    Review of Systems  Constitutional: Negative for fever and malaise/fatigue.  HENT: Positive for congestion.   Eyes: Positive for discharge and redness. Negative for blurred vision and pain.  Respiratory: Negative for cough and shortness of breath.   Cardiovascular: Negative for chest pain, palpitations and leg swelling.  Gastrointestinal: Negative for vomiting.  Musculoskeletal: Negative for back pain.  Skin: Negative for rash.  Neurological: Negative for loss of consciousness and headaches.       Objective:    Physical Exam  Constitutional: She is oriented to person, place, and time. She appears well-developed and well-nourished. No distress.  HENT:  Head: Normocephalic and atraumatic.  Eyes: Conjunctivae are normal.  Neck: Normal range of motion. No thyromegaly present.  Cardiovascular: Normal rate and regular rhythm.   Pulmonary/Chest: Effort normal and breath sounds normal. She has no wheezes.  Abdominal: Soft. Bowel sounds are normal. There is no tenderness.  Musculoskeletal: Normal range of motion. She exhibits no edema or deformity.  Neurological: She is alert and oriented to person, place, and time.  Skin: Skin is warm and dry. She is not diaphoretic.  Psychiatric: She has a normal mood and affect.    BP 110/72 (BP Location: Left Arm,  Patient Position: Sitting, Cuff Size: Normal)   Pulse (!) 57   Temp 98.2 F (36.8 C) (Oral)   Resp 18   Wt 142 lb 3.2 oz (64.5 kg)   SpO2 98%   BMI 25.19 kg/m  Wt Readings from Last 3 Encounters:  02/21/17 142 lb 3.2 oz (64.5 kg)  11/28/16 139 lb (63 kg)  11/04/16 138 lb 8 oz (62.8 kg)   BP Readings from Last 3 Encounters:  02/21/17 110/72  11/28/16 (!) 130/92  11/04/16 120/74  Immunization History  Administered Date(s) Administered  . Tdap 08/27/2015    Health Maintenance  Topic Date Due  . COLONOSCOPY  04/03/2007  . INFLUENZA VACCINE  09/18/2017 (Originally 03/22/2017)  . PAP SMEAR  05/13/2017  . MAMMOGRAM  08/26/2017  . TETANUS/TDAP  08/26/2025  . Hepatitis C Screening  Completed  . HIV Screening  Completed    Lab Results  Component Value Date   WBC 5.9 08/27/2015   HGB 13.3 08/27/2015   HCT 42.0 08/27/2015   PLT 276.0 08/27/2015   GLUCOSE 83 04/11/2016   CHOL 237 (H) 04/11/2016   TRIG 163.0 (H) 04/11/2016   HDL 66.10 04/11/2016   LDLCALC 138 (H) 04/11/2016   ALT 14 04/11/2016   AST 16 04/11/2016   NA 140 04/11/2016   K 4.0 04/11/2016   CL 103 04/11/2016   CREATININE 0.82 04/11/2016   BUN 7 04/11/2016   CO2 30 04/11/2016   TSH 0.62 08/27/2015    Lab Results  Component Value Date   TSH 0.62 08/27/2015   Lab Results  Component Value Date   WBC 5.9 08/27/2015   HGB 13.3 08/27/2015   HCT 42.0 08/27/2015   MCV 85.4 08/27/2015   PLT 276.0 08/27/2015   Lab Results  Component Value Date   NA 140 04/11/2016   K 4.0 04/11/2016   CO2 30 04/11/2016   GLUCOSE 83 04/11/2016   BUN 7 04/11/2016   CREATININE 0.82 04/11/2016   BILITOT 0.3 04/11/2016   ALKPHOS 66 04/11/2016   AST 16 04/11/2016   ALT 14 04/11/2016   PROT 7.5 04/11/2016   ALBUMIN 4.3 04/11/2016   CALCIUM 9.3 04/11/2016   GFR 91.76 04/11/2016   Lab Results  Component Value Date   CHOL 237 (H) 04/11/2016   Lab Results  Component Value Date   HDL 66.10 04/11/2016   Lab Results   Component Value Date   LDLCALC 138 (H) 04/11/2016   Lab Results  Component Value Date   TRIG 163.0 (H) 04/11/2016   Lab Results  Component Value Date   CHOLHDL 4 04/11/2016   No results found for: HGBA1C       Assessment & Plan:   Problem List Items Addressed This Visit    Depression with anxiety    Doing well most days on Venlafaxine will let us know if she needs any changes.      Hypertension    Well controlled, no changes to meds. Encouraged heart healthy diet such as the DASH diet and exercise as tolerated.       Relevant Orders   CBC   Comprehensive metabolic panel   TSH   Hyperlipidemia, mixed    Encouraged heart healthy diet, increase exercise, avoid trans fats, consider a krill oil cap daily      Relevant Orders   Lipid panel   Low back pain    Encouraged moist heat and gentle stretching as tolerated. May try NSAIDs and prescription meds as directed and report if symptoms worsen or seek immediate care. Gets relief from Hydrocodone so may continue prn use      Relevant Medications   tiZANidine (ZANAFLEX) 4 MG tablet   HYDROcodone-acetaminophen (NORCO) 10-325 MG tablet   Allergic conjunctivitis     Continue eye drops and ad Flonase       Other Visit Diagnoses    Depression, unspecified depression type    -  Primary   Relevant Orders   Ambulatory referral to Behavioral Health   Neck pain  Relevant Medications   HYDROcodone-acetaminophen (NORCO) 10-325 MG tablet      I have discontinued Ms. Martinson's gentamicin and ciprofloxacin. I am also having her start on fluticasone and tiZANidine. Additionally, I am having her maintain her aspirin, Vitamin D, multivitamin, Omega-3 Fatty Acids (OMEGA 3 PO), cetirizine, hydrOXYzine, amphetamine-dextroamphetamine, ibuprofen, mupirocin cream, ALPRAZolam, citalopram, oxybutynin, venlafaxine XR, pregabalin, tobramycin, and HYDROcodone-acetaminophen.  Meds ordered this encounter  Medications  . fluticasone  (FLONASE) 50 MCG/ACT nasal spray    Sig: Place 2 sprays into both nostrils daily.    Dispense:  16 g    Refill:  2  . tobramycin (TOBREX) 0.3 % ophthalmic solution    Sig: Place 2 drops into the left eye every 6 (six) hours.    Dispense:  5 mL    Refill:  0  . tiZANidine (ZANAFLEX) 4 MG tablet    Sig: Take 1 tablet (4 mg total) by mouth every 6 (six) hours as needed for muscle spasms.    Dispense:  30 tablet    Refill:  0  . DISCONTD: HYDROcodone-acetaminophen (NORCO) 10-325 MG tablet    Sig: Take 1 tablet by mouth every 8 (eight) hours as needed.    Dispense:  30 tablet    Refill:  0  . HYDROcodone-acetaminophen (NORCO) 10-325 MG tablet    Sig: Take 1 tablet by mouth every 8 (eight) hours as needed.    Dispense:  60 tablet    Refill:  0    CMA served as scribe during this visit. History, Physical and Plan performed by medical provider. Documentation and orders reviewed and attested to.  Danise Edge, MD

## 2017-02-21 NOTE — Patient Instructions (Addendum)
Advil/Motrin/Ibuprofen 200 mg tabs, 2 tabs twice a day Tylenol/Acetaminophen ES 500 mg 2 tabs twice a day Lidocaine gel or patches  Back Pain, Adult Back pain is very common in adults.The cause of back pain is rarely dangerous and the pain often gets better over time.The cause of your back pain may not be known. Some common causes of back pain include:  Strain of the muscles or ligaments supporting the spine.  Wear and tear (degeneration) of the spinal disks.  Arthritis.  Direct injury to the back.  For many people, back pain may return. Since back pain is rarely dangerous, most people can learn to manage this condition on their own. Follow these instructions at home: Watch your back pain for any changes. The following actions may help to lessen any discomfort you are feeling:  Remain active. It is stressful on your back to sit or stand in one place for long periods of time. Do not sit, drive, or stand in one place for more than 30 minutes at a time. Take short walks on even surfaces as soon as you are able.Try to increase the length of time you walk each day.  Exercise regularly as directed by your health care provider. Exercise helps your back heal faster. It also helps avoid future injury by keeping your muscles strong and flexible.  Do not stay in bed.Resting more than 1-2 days can delay your recovery.  Pay attention to your body when you bend and lift. The most comfortable positions are those that put less stress on your recovering back. Always use proper lifting techniques, including: ? Bending your knees. ? Keeping the load close to your body. ? Avoiding twisting.  Find a comfortable position to sleep. Use a firm mattress and lie on your side with your knees slightly bent. If you lie on your back, put a pillow under your knees.  Avoid feeling anxious or stressed.Stress increases muscle tension and can worsen back pain.It is important to recognize when you are anxious or  stressed and learn ways to manage it, such as with exercise.  Take medicines only as directed by your health care provider. Over-the-counter medicines to reduce pain and inflammation are often the most helpful.Your health care provider may prescribe muscle relaxant drugs.These medicines help dull your pain so you can more quickly return to your normal activities and healthy exercise.  Apply ice to the injured area: ? Put ice in a plastic bag. ? Place a towel between your skin and the bag. ? Leave the ice on for 20 minutes, 2-3 times a day for the first 2-3 days. After that, ice and heat may be alternated to reduce pain and spasms.  Maintain a healthy weight. Excess weight puts extra stress on your back and makes it difficult to maintain good posture.  Contact a health care provider if:  You have pain that is not relieved with rest or medicine.  You have increasing pain going down into the legs or buttocks.  You have pain that does not improve in one week.  You have night pain.  You lose weight.  You have a fever or chills. Get help right away if:  You develop new bowel or bladder control problems.  You have unusual weakness or numbness in your arms or legs.  You develop nausea or vomiting.  You develop abdominal pain.  You feel faint. This information is not intended to replace advice given to you by your health care provider. Make sure you discuss  any questions you have with your health care provider. Document Released: 08/08/2005 Document Revised: 12/17/2015 Document Reviewed: 12/10/2013 Elsevier Interactive Patient Education  2017 [employer].

## 2017-02-21 NOTE — Assessment & Plan Note (Signed)
Well controlled, no changes to meds. Encouraged heart healthy diet such as the DASH diet and exercise as tolerated.  °

## 2017-02-21 NOTE — Assessment & Plan Note (Signed)
Encouraged heart healthy diet, increase exercise, avoid trans fats, consider a krill oil cap daily 

## 2017-02-22 ENCOUNTER — Encounter: Payer: Self-pay | Admitting: Family Medicine

## 2017-02-22 DIAGNOSIS — H1010 Acute atopic conjunctivitis, unspecified eye: Secondary | ICD-10-CM

## 2017-02-22 HISTORY — DX: Acute atopic conjunctivitis, unspecified eye: H10.10

## 2017-02-22 NOTE — Assessment & Plan Note (Signed)
Continue eye drops and ad Flonase

## 2017-02-22 NOTE — Assessment & Plan Note (Signed)
Doing well most days on Venlafaxine will let us know if she needs any changes.

## 2017-02-22 NOTE — Assessment & Plan Note (Signed)
Encouraged moist heat and gentle stretching as tolerated. May try NSAIDs and prescription meds as directed and report if symptoms worsen or seek immediate care. Gets relief from Hydrocodone so may continue prn use

## 2017-03-03 ENCOUNTER — Encounter: Payer: Self-pay | Admitting: Internal Medicine

## 2017-03-03 ENCOUNTER — Ambulatory Visit (INDEPENDENT_AMBULATORY_CARE_PROVIDER_SITE_OTHER): Payer: BLUE CROSS/BLUE SHIELD | Admitting: Internal Medicine

## 2017-03-03 VITALS — BP 130/77 | HR 58 | Temp 98.2°F | Ht 63.0 in | Wt 136.2 lb

## 2017-03-03 DIAGNOSIS — R21 Rash and other nonspecific skin eruption: Secondary | ICD-10-CM

## 2017-03-03 MED ORDER — CLOBETASOL PROPIONATE 0.05 % EX OINT: 1.0000 "application " | TOPICAL_OINTMENT | Freq: Two times a day (BID) | CUTANEOUS | 0 refills | Status: DC

## 2017-03-03 NOTE — Progress Notes (Signed)
Pre visit review using our clinic review tool, if applicable. No additional management support is needed unless otherwise documented below in the visit note. 

## 2017-03-03 NOTE — Patient Instructions (Signed)
Use Temovate twice a day for 5 days.  If you are improving, let me know for a dermatology referral

## 2017-03-03 NOTE — Progress Notes (Signed)
Subjective:    Patient ID: Suzanne Barber, female    DOB: [birth year], 61 y.o.   MRN: [id number]  DOS:  03/03/2017 Type of visit - description : acute Interval history: Developed a rash at the right side of the neck, extremely pruritic, hurts very little. This is not the first time this happens, reports that a ointment rx by PCP before help significantly, I  reviewed the chart and I believe  the ointment  is Temovate. When this happens is usually on the right side of the neck. Denies fever or chills Denies any exposure to a new chemical  or jewerly. I asked about poison ivy exposure, she denies it. Patient is not sure if these started as blisters.   Review of Systems See above  Past Medical History:  Diagnosis Date  . Acute bronchitis 08/24/2016  . ADD (attention deficit disorder)   . Allergic conjunctivitis 02/22/2017  . Depression   . Depression with anxiety   . Frequent headaches   . Hyperlipidemia, mixed 09/06/2015  . Hypertension   . Low back pain 04/11/2016  . OAB (overactive bladder)   . Preventative health care 09/06/2015  . Urine incontinence     Past Surgical History:  Procedure Laterality Date  . BREAST BIOPSY  2006    Social History   Social History  . Marital status: Married    Spouse name: N/A  . Number of children: N/A  . Years of education: N/A   Occupational History  . Not on file.   Social History Main Topics  . Smoking status: Former Smoker    Quit date: 05/14/1983  . Smokeless tobacco: Never Used  . Alcohol use Yes  . Drug use: No  . Sexual activity: Yes     Comment: married, husband in [place], no major dietary restrictions. eats hi protein minimizes sugar   Other Topics Concern  . Not on file   Social History Narrative  . No narrative on file      Allergies as of 03/03/2017      Reactions   Shellfish Allergy Rash   Penicillins Itching, Swelling   Latex Rash      Medication List       Accurate as of 03/03/17 11:59 PM. Always  use your most recent med list.          ALPRAZolam 0.25 MG tablet Commonly known as:  XANAX Take 1-2 tablets (0.25-0.5 mg total) by mouth 2 (two) times daily as needed for anxiety.   amphetamine-dextroamphetamine 5 MG tablet Commonly known as:  ADDERALL Take 1 tablet (5 mg total) by mouth 2 (two) times daily with a meal. october 2016   aspirin 81 MG EC tablet Take 81 mg by mouth daily.   cetirizine 10 MG tablet Commonly known as:  ZYRTEC Take 10 mg by mouth daily. Reported on 12/08/2015   citalopram 40 MG tablet Commonly known as:  CELEXA Take 1 tablet (40 mg total) by mouth daily.   clobetasol ointment 0.05 % Commonly known as:  TEMOVATE Apply 1 application topically 2 (two) times daily.   fluticasone 50 MCG/ACT nasal spray Commonly known as:  FLONASE Place 2 sprays into both nostrils daily.   HYDROcodone-acetaminophen 10-325 MG tablet Commonly known as:  NORCO Take 1 tablet by mouth every 8 (eight) hours as needed.   hydrOXYzine 25 MG tablet Commonly known as:  ATARAX/VISTARIL Take 1 tablet (25 mg total) by mouth every 8 (eight) hours as needed for itching.   ibuprofen  600 MG tablet Commonly known as:  ADVIL,MOTRIN Take 1 tablet (600 mg total) by mouth every 6 (six) hours as needed.   multivitamin tablet Take 1 tablet by mouth daily.   mupirocin cream 2 % Commonly known as:  BACTROBAN Apply 1 application topically 2 (two) times daily.   OMEGA 3 PO Take by mouth 2 (two) times daily.   oxybutynin 5 MG tablet Commonly known as:  DITROPAN Take 1 tablet (5 mg total) by mouth daily as needed for bladder spasms.   pregabalin 75 MG capsule Commonly known as:  LYRICA Take 1-2 capsules (75-150 mg total) by mouth 3 (three) times daily.   tiZANidine 4 MG tablet Commonly known as:  ZANAFLEX Take 1 tablet (4 mg total) by mouth every 6 (six) hours as needed for muscle spasms.   tobramycin 0.3 % ophthalmic solution Commonly known as:  TOBREX Place 2 drops into the  left eye every 6 (six) hours.   venlafaxine XR 75 MG 24 hr capsule Commonly known as:  EFFEXOR XR Take 1 capsule (75 mg total) by mouth daily with breakfast.   Vitamin D 2000 units Caps Take by mouth.          Objective:   Physical Exam BP 130/77 (BP Location: Left Arm, Patient Position: Sitting, Cuff Size: Small)   Pulse (!) 58   Temp 98.2 F (36.8 C) (Oral)   Ht 5\' 3"  (1.6 m)   Wt 136 lb 4 oz (61.8 kg)   SpO2 100%   BMI 24.14 kg/m  General:   Well developed, well nourished . NAD.  HEENT:  Normocephalic . Face symmetric, atraumatic Skin: Multiple 1,2 or 3 mm round skin lesion @ the R neck, minimal erythema around them. No clear-cut blisters. See picture Neurologic:  alert & oriented X3.  Speech normal, gait appropriate for age and unassisted Psych--  Cognition and judgment appear intact.  Cooperative with normal attention span and concentration.  Behavior appropriate. No anxious or depressed appearing.           Assessment & Plan:    61 year old lady with history of depression, anxiety, frequent headaches, high cholesterol, HTN, low back pain, overactive bladder, ADD presents with the following:  Rash: Recurrent pruritic rash at the right side of the neck. Etiology not completely clear, historically Temovate helps. Prescribed Temovate, call if no better. Referral?

## 2017-03-21 ENCOUNTER — Other Ambulatory Visit: Payer: Self-pay | Admitting: Family Medicine

## 2017-03-21 NOTE — Telephone Encounter (Signed)
Requesting:   Alprazolam and lyrica Contract    02/21/2017 UDS   Low risk next is due on 08/17/2017 Last OV     02/21/2017 Last Refill   Alprazolam   #40 no refills on 09/15/2016                     Lyrica   #120 no refills on 11/10/2016  Please Advise

## 2017-03-21 NOTE — Telephone Encounter (Signed)
rx faxed to [facility] [address] 

## 2017-03-27 ENCOUNTER — Telehealth: Payer: Self-pay | Admitting: Family Medicine

## 2017-03-27 DIAGNOSIS — F988 Other specified behavioral and emotional disorders with onset usually occurring in childhood and adolescence: Secondary | ICD-10-CM

## 2017-03-27 MED ORDER — AMPHETAMINE-DEXTROAMPHETAMINE 5 MG PO TABS: 5.0000 mg | ORAL_TABLET | Freq: Two times a day (BID) | ORAL | 0 refills | Status: DC

## 2017-03-27 NOTE — Telephone Encounter (Signed)
She can have a 30 day supply of her medication with 1 rfand then follow up next month

## 2017-03-27 NOTE — Telephone Encounter (Signed)
Adderall request Last filled on 05/07/2015  #60 Last offie visit on 02/21/2017 Contract---02/21/2017 UDS   Low risk on 08/17/2017

## 2017-03-27 NOTE — Telephone Encounter (Signed)
Printed/pcp signed Called the patient left a message hardcopy is ready for pickup at the front desk.

## 2017-03-27 NOTE — Telephone Encounter (Signed)
Pt called in to schedule a follow up with PCP. Pt says that she is completely out of her medication. PCP next available is on 04/29/17. Pt would like to know if PCP is able to supply a refill until apt. ?

## 2017-03-27 NOTE — Telephone Encounter (Signed)
Self.  Refill for Adderall  CB: [phone]

## 2017-04-07 ENCOUNTER — Other Ambulatory Visit: Payer: Self-pay | Admitting: Family Medicine

## 2017-04-07 DIAGNOSIS — M542 Cervicalgia: Secondary | ICD-10-CM

## 2017-04-07 NOTE — Telephone Encounter (Signed)
Self.    Refill request for HYDROcodone   CB: [phone]

## 2017-04-07 NOTE — Telephone Encounter (Signed)
Requesting:Norco Contract:Yes   UDS: 02/15/17 Suzanne Barber OV: 03/03/17 Suzanne Barber Refill: 02/21/17  #[id number]  Sig: 1 tab q8hr  Please Advise

## 2017-04-08 MED ORDER — HYDROCODONE-ACETAMINOPHEN 10-325 MG PO TABS: 1.0000 | ORAL_TABLET | Freq: Three times a day (TID) | ORAL | 0 refills | Status: DC | PRN

## 2017-04-10 NOTE — Telephone Encounter (Signed)
Patient notified rx is ready for pick up   PC

## 2017-04-27 ENCOUNTER — Ambulatory Visit: Payer: BLUE CROSS/BLUE SHIELD | Admitting: Family Medicine

## 2017-05-09 ENCOUNTER — Ambulatory Visit (INDEPENDENT_AMBULATORY_CARE_PROVIDER_SITE_OTHER): Payer: BLUE CROSS/BLUE SHIELD | Admitting: Family Medicine

## 2017-05-09 ENCOUNTER — Encounter: Payer: Self-pay | Admitting: Family Medicine

## 2017-05-09 VITALS — BP 128/82 | HR 49 | Temp 97.8°F | Resp 18 | Wt 142.6 lb

## 2017-05-09 DIAGNOSIS — I10 Essential (primary) hypertension: Secondary | ICD-10-CM | POA: Diagnosis not present

## 2017-05-09 DIAGNOSIS — M542 Cervicalgia: Secondary | ICD-10-CM | POA: Diagnosis not present

## 2017-05-09 DIAGNOSIS — F418 Other specified anxiety disorders: Secondary | ICD-10-CM | POA: Diagnosis not present

## 2017-05-09 DIAGNOSIS — Z23 Encounter for immunization: Secondary | ICD-10-CM | POA: Diagnosis not present

## 2017-05-09 DIAGNOSIS — E782 Mixed hyperlipidemia: Secondary | ICD-10-CM

## 2017-05-09 MED ORDER — HYDROXYZINE HCL 25 MG PO TABS: 25.0000 mg | ORAL_TABLET | Freq: Three times a day (TID) | ORAL | 3 refills | Status: DC | PRN

## 2017-05-09 MED ORDER — TOBRAMYCIN 0.3 % OP SOLN: 2.0000 [drp] | Freq: Four times a day (QID) | OPHTHALMIC | 1 refills | Status: DC

## 2017-05-09 MED ORDER — HYDROCODONE-ACETAMINOPHEN 10-325 MG PO TABS: 1.0000 | ORAL_TABLET | Freq: Three times a day (TID) | ORAL | 0 refills | Status: DC | PRN

## 2017-05-09 MED ORDER — VENLAFAXINE HCL ER 75 MG PO CP24: 75.0000 mg | ORAL_CAPSULE | Freq: Every day | ORAL | 5 refills | Status: DC

## 2017-05-09 MED ORDER — OXYBUTYNIN CHLORIDE 5 MG PO TABS: 5.0000 mg | ORAL_TABLET | Freq: Every day | ORAL | 1 refills | Status: DC | PRN

## 2017-05-09 NOTE — Patient Instructions (Signed)

## 2017-05-09 NOTE — Progress Notes (Signed)
Subjective:  I acted as a Neurosurgeon for Dr. Abner Greenspan. Suzanne Barber, Suzanne Barber  Patient ID: Suzanne Barber, female    DOB: Jun 16, 1956, 61 y.o.   MRN: [id number]  No chief complaint on file.   HPI  Patient is in today for a medication refill and also talk about her depression. She states she can not pull her self in to doing what she loves which is workout. She has been under a great deal of stress with family concerns. She acknowledges anhedonia but denies suicidal ideation. She also notes chronic neck and low back pain. Denies CP/palp/SOB/HA/congestion/fevers/GI or GU c/o. Taking meds as prescribed  Patient Care Team: Bradd Canary, MD as PCP - General (Family Medicine)   Past Medical History:  Diagnosis Date  . Acute bronchitis 08/24/2016  . ADD (attention deficit disorder)   . Allergic conjunctivitis 02/22/2017  . Depression   . Depression with anxiety   . Frequent headaches   . Hyperlipidemia, mixed 09/06/2015  . Hypertension   . Low back pain 04/11/2016  . OAB (overactive bladder)   . Preventative health care 09/06/2015  . Urine incontinence     Past Surgical History:  Procedure Laterality Date  . BREAST BIOPSY  2006    Family History  Problem Relation Age of Onset  . Diabetes Mother   . Colon cancer Mother   . Stroke Mother   . Hypertension Mother   . Colon cancer Sister   . Cancer Sister 7       colon  . Diabetes Sister   . Breast cancer Sister   . Cancer Sister        breast  . Breast cancer Maternal Grandmother   . Alcohol abuse Father        cirrhosis  . Diverticulitis Father   . Cancer Sister        cervix  . Heart disease Brother 78  . Mental illness Brother        schizophrenia    Social History   Social History  . Marital status: Married    Spouse name: N/A  . Number of children: N/A  . Years of education: N/A   Occupational History  . Not on file.   Social History Main Topics  . Smoking status: Former Smoker    Quit date: 05/14/1983  . Smokeless  tobacco: Never Used  . Alcohol use Yes  . Drug use: No  . Sexual activity: Yes     Comment: married, husband in [place], no major dietary restrictions. eats hi protein minimizes sugar   Other Topics Concern  . Not on file   Social History Narrative  . No narrative on file    Outpatient Medications Prior to Visit  Medication Sig Dispense Refill  . ALPRAZolam (XANAX) 0.25 MG tablet TAKE 1 TABLET BY MOUTH TWICE A DAY AS NEEDED FOR ANXIETY 40 tablet 2  . amphetamine-dextroamphetamine (ADDERALL) 5 MG tablet Take 1 tablet (5 mg total) by mouth 2 (two) times daily with a meal. August 2018 60 tablet 0  . aspirin 81 MG EC tablet Take 81 mg by mouth daily.    . cetirizine (ZYRTEC) 10 MG tablet Take 10 mg by mouth daily. Reported on 12/08/2015    . Cholecalciferol (VITAMIN D) 2000 UNITS CAPS Take by mouth.    . citalopram (CELEXA) 40 MG tablet Take 1 tablet (40 mg total) by mouth daily. 30 tablet 3  . clobetasol ointment (TEMOVATE) 0.05 % Apply 1 application topically 2 (two)  times daily. 30 g 0  . fluticasone (FLONASE) 50 MCG/ACT nasal spray Place 2 sprays into both nostrils daily. 16 g 2  . ibuprofen (ADVIL,MOTRIN) 600 MG tablet Take 1 tablet (600 mg total) by mouth every 6 (six) hours as needed. 90 tablet 2  . LYRICA 75 MG capsule TAKE 1 TO 2 CAPSULES BY MOUTH 3 TIMES A DAY 120 capsule 2  . Multiple Vitamin (MULTIVITAMIN) tablet Take 1 tablet by mouth daily.    . mupirocin cream (BACTROBAN) 2 % Apply 1 application topically 2 (two) times daily. 15 g 1  . Omega-3 Fatty Acids (OMEGA 3 PO) Take by mouth 2 (two) times daily.    Marland Kitchen tiZANidine (ZANAFLEX) 4 MG tablet Take 1 tablet (4 mg total) by mouth every 6 (six) hours as needed for muscle spasms. 30 tablet 0  . HYDROcodone-acetaminophen (NORCO) 10-325 MG tablet Take 1 tablet by mouth every 8 (eight) hours as needed. 60 tablet 0  . hydrOXYzine (ATARAX/VISTARIL) 25 MG tablet Take 1 tablet (25 mg total) by mouth every 8 (eight) hours as needed for  itching. 21 tablet 0  . oxybutynin (DITROPAN) 5 MG tablet Take 1 tablet (5 mg total) by mouth daily as needed for bladder spasms. 30 tablet 2  . tobramycin (TOBREX) 0.3 % ophthalmic solution Place 2 drops into the left eye every 6 (six) hours. 5 mL 0  . venlafaxine XR (EFFEXOR XR) 75 MG 24 hr capsule Take 1 capsule (75 mg total) by mouth daily with breakfast. 30 capsule 2   No facility-administered medications prior to visit.     Allergies  Allergen Reactions  . Shellfish Allergy Rash  . Penicillins Itching and Swelling  . Latex Rash    Review of Systems  Constitutional: Negative for fever and malaise/fatigue.  HENT: Negative for congestion.   Eyes: Negative for blurred vision.  Respiratory: Negative for shortness of breath.   Cardiovascular: Negative for chest pain, palpitations and leg swelling.  Gastrointestinal: Negative for abdominal pain, blood in stool and nausea.  Genitourinary: Negative for dysuria and frequency.  Musculoskeletal: Positive for back pain and neck pain. Negative for falls.  Skin: Negative for rash.  Neurological: Negative for dizziness, loss of consciousness and headaches.  Endo/Heme/Allergies: Negative for environmental allergies.  Psychiatric/Behavioral: Positive for depression.       Objective:    Physical Exam  Constitutional: She is oriented to person, place, and time. She appears well-developed and well-nourished. No distress.  HENT:  Head: Normocephalic and atraumatic.  Nose: Nose normal.  Eyes: Right eye exhibits no discharge. Left eye exhibits no discharge.  Neck: Normal range of motion. Neck supple.  Cardiovascular: Normal rate and regular rhythm.   No murmur heard. Pulmonary/Chest: Effort normal and breath sounds normal.  Abdominal: Soft. Bowel sounds are normal. There is no tenderness.  Musculoskeletal: She exhibits no edema.  Neurological: She is alert and oriented to person, place, and time.  Skin: Skin is warm and dry.    Psychiatric: She has a normal mood and affect.  Nursing note and vitals reviewed.   BP 128/82 (BP Location: Left Arm, Patient Position: Sitting, Cuff Size: Normal)   Pulse (!) 49   Temp 97.8 F (36.6 C) (Oral)   Resp 18   Wt 142 lb 9.6 oz (64.7 kg)   SpO2 98%   BMI 25.26 kg/m  Wt Readings from Last 3 Encounters:  05/09/17 142 lb 9.6 oz (64.7 kg)  03/03/17 136 lb 4 oz (61.8 kg)  02/21/17 142 lb  3.2 oz (64.5 kg)   BP Readings from Last 3 Encounters:  05/09/17 128/82  03/03/17 130/77  02/21/17 110/72     Immunization History  Administered Date(s) Administered  . Influenza, Seasonal, Injecte, Preservative Fre 07/02/2014  . Tdap 12/20/2011, 08/27/2015    Health Maintenance  Topic Date Due  . COLONOSCOPY  04/03/2007  . INFLUENZA VACCINE  03/22/2017  . PAP SMEAR  05/13/2017  . MAMMOGRAM  08/26/2017  . TETANUS/TDAP  08/26/2025  . Hepatitis C Screening  Completed  . HIV Screening  Completed    Lab Results  Component Value Date   WBC 5.9 08/27/2015   HGB 13.3 08/27/2015   HCT 42.0 08/27/2015   PLT 276.0 08/27/2015   GLUCOSE 83 04/11/2016   CHOL 237 (H) 04/11/2016   TRIG 163.0 (H) 04/11/2016   HDL 66.10 04/11/2016   LDLCALC 138 (H) 04/11/2016   ALT 14 04/11/2016   AST 16 04/11/2016   NA 140 04/11/2016   K 4.0 04/11/2016   CL 103 04/11/2016   CREATININE 0.82 04/11/2016   BUN 7 04/11/2016   CO2 30 04/11/2016   TSH 0.62 08/27/2015    Lab Results  Component Value Date   TSH 0.62 08/27/2015   Lab Results  Component Value Date   WBC 5.9 08/27/2015   HGB 13.3 08/27/2015   HCT 42.0 08/27/2015   MCV 85.4 08/27/2015   PLT 276.0 08/27/2015   Lab Results  Component Value Date   NA 140 04/11/2016   K 4.0 04/11/2016   CO2 30 04/11/2016   GLUCOSE 83 04/11/2016   BUN 7 04/11/2016   CREATININE 0.82 04/11/2016   BILITOT 0.3 04/11/2016   ALKPHOS 66 04/11/2016   AST 16 04/11/2016   ALT 14 04/11/2016   PROT 7.5 04/11/2016   ALBUMIN 4.3 04/11/2016   CALCIUM  9.3 04/11/2016   GFR 91.76 04/11/2016   Lab Results  Component Value Date   CHOL 237 (H) 04/11/2016   Lab Results  Component Value Date   HDL 66.10 04/11/2016   Lab Results  Component Value Date   LDLCALC 138 (H) 04/11/2016   Lab Results  Component Value Date   TRIG 163.0 (H) 04/11/2016   Lab Results  Component Value Date   CHOLHDL 4 04/11/2016   No results found for: HGBA1C       Assessment & Plan:   Problem List Items Addressed This Visit    Depression with anxiety    Agrees to continue with Effexor at 75 mg and accepts a referral to  Behavioral health to help her through this rough time. She will return for reevaluation as scheduled or as needed.       Relevant Orders   Ambulatory referral to Behavioral Health   Hypertension    Well controlled, no changes to meds. Encouraged heart healthy diet such as the DASH diet and exercise as tolerated.       Relevant Orders   CBC   Comprehensive metabolic panel   TSH   Neck pain    Encouraged moist heat and gentle stretching as tolerated. May try NSAIDs and prescription meds as directed and report if symptoms worsen or seek immediate care.      Relevant Medications   HYDROcodone-acetaminophen (NORCO) 10-325 MG tablet   Hyperlipidemia, mixed    Encouraged heart healthy diet, increase exercise, avoid trans fats, consider a krill oil cap daily      Relevant Orders   Lipid panel    Other Visit Diagnoses  Needs flu shot    -  Primary   Relevant Orders   Flu Vaccine QUAD 6+ mos PF IM (Fluarix Quad PF)      I am having Ms. Hofbauer maintain her aspirin, Vitamin D, multivitamin, Omega-3 Fatty Acids (OMEGA 3 PO), cetirizine, ibuprofen, mupirocin cream, citalopram, fluticasone, tiZANidine, clobetasol ointment, LYRICA, ALPRAZolam, amphetamine-dextroamphetamine, oxybutynin, venlafaxine XR, HYDROcodone-acetaminophen, hydrOXYzine, and tobramycin.  Meds ordered this encounter  Medications  . oxybutynin (DITROPAN) 5 MG  tablet    Sig: Take 1 tablet (5 mg total) by mouth daily as needed for bladder spasms.    Dispense:  90 tablet    Refill:  1  . venlafaxine XR (EFFEXOR XR) 75 MG 24 hr capsule    Sig: Take 1 capsule (75 mg total) by mouth daily with breakfast.    Dispense:  30 capsule    Refill:  5  . HYDROcodone-acetaminophen (NORCO) 10-325 MG tablet    Sig: Take 1 tablet by mouth every 8 (eight) hours as needed.    Dispense:  60 tablet    Refill:  0  . hydrOXYzine (ATARAX/VISTARIL) 25 MG tablet    Sig: Take 1 tablet (25 mg total) by mouth every 8 (eight) hours as needed for itching.    Dispense:  30 tablet    Refill:  3  . tobramycin (TOBREX) 0.3 % ophthalmic solution    Sig: Place 2 drops into the left eye every 6 (six) hours.    Dispense:  5 mL    Refill:  1    CMA served as scribe during this visit. History, Physical and Plan performed by medical provider. Documentation and orders reviewed and attested to.  Danise Edge, MD

## 2017-05-14 NOTE — Assessment & Plan Note (Signed)
Agrees to continue with Effexor at 75 mg and accepts a referral to  Behavioral health to help her through this rough time. She will return for reevaluation as scheduled or as needed.

## 2017-05-14 NOTE — Assessment & Plan Note (Signed)
Encouraged heart healthy diet, increase exercise, avoid trans fats, consider a krill oil cap daily 

## 2017-05-14 NOTE — Assessment & Plan Note (Signed)
Encouraged moist heat and gentle stretching as tolerated. May try NSAIDs and prescription meds as directed and report if symptoms worsen or seek immediate care 

## 2017-05-14 NOTE — Assessment & Plan Note (Signed)
Well controlled, no changes to meds. Encouraged heart healthy diet such as the DASH diet and exercise as tolerated.  °

## 2017-05-29 ENCOUNTER — Telehealth: Payer: Self-pay | Admitting: Family Medicine

## 2017-05-29 ENCOUNTER — Other Ambulatory Visit: Payer: Self-pay | Admitting: Family Medicine

## 2017-05-29 ENCOUNTER — Encounter: Payer: Self-pay | Admitting: Family Medicine

## 2017-05-29 DIAGNOSIS — Z800 Family history of malignant neoplasm of digestive organs: Secondary | ICD-10-CM

## 2017-05-29 DIAGNOSIS — Z1211 Encounter for screening for malignant neoplasm of colon: Secondary | ICD-10-CM

## 2017-05-29 DIAGNOSIS — H5789 Other specified disorders of eye and adnexa: Secondary | ICD-10-CM

## 2017-05-29 NOTE — Telephone Encounter (Signed)
error:[id number] ° °

## 2017-05-29 NOTE — Telephone Encounter (Signed)
Please advise 

## 2017-05-29 NOTE — Telephone Encounter (Signed)
°  Relation to pt: self  Call back [phone]   Reason for call:  Patient states at last office visit she informed PCP she was experiencing eye concerns such as swollen eye, itchiness and she stated the nurse washed her eyes. Patient states symptoms have not improved patient requesting optmaologist referral to [facility] or [place], please advise  Patient states she would like colonscopy orders please place [facility] or [place] location preferably within [place], please advise

## 2017-05-29 NOTE — Telephone Encounter (Signed)
I referred her to opthamology but just let her know we do not have a [facility] opthamologist but we will set her up in the [facility] system.

## 2017-05-31 NOTE — Telephone Encounter (Signed)
Spoke with pt. She was seen by [facility] and very pleased with doctor. Also states that she is due for repeat colonoscopy and needs one every 5 yrs. States she went to [facility] in [place] last time but drive is too far for her and she requests referral to [facility]. Referral placed. Advised pt she will need to get copy of most recent colonoscopy report for new referral to [facility]. Pt voices understanding.

## 2017-06-19 ENCOUNTER — Telehealth: Payer: Self-pay | Admitting: Family Medicine

## 2017-06-19 DIAGNOSIS — M542 Cervicalgia: Secondary | ICD-10-CM

## 2017-06-19 DIAGNOSIS — F988 Other specified behavioral and emotional disorders with onset usually occurring in childhood and adolescence: Secondary | ICD-10-CM

## 2017-06-19 NOTE — Telephone Encounter (Signed)
Patient would like Hydrocodone, Adderall and ZanX all refill at [facility]- same as prior..  Please call back if needed at  [phone]

## 2017-06-20 MED ORDER — HYDROCODONE-ACETAMINOPHEN 10-325 MG PO TABS: 1.0000 | ORAL_TABLET | Freq: Three times a day (TID) | ORAL | 0 refills | Status: DC | PRN

## 2017-06-20 MED ORDER — ALPRAZOLAM 0.25 MG PO TABS: ORAL_TABLET | ORAL | 0 refills | Status: DC

## 2017-06-20 MED ORDER — ALPRAZOLAM 0.25 MG PO TABS: ORAL_TABLET | ORAL | 1 refills | Status: DC

## 2017-06-20 MED ORDER — AMPHETAMINE-DEXTROAMPHETAMINE 5 MG PO TABS: 5.0000 mg | ORAL_TABLET | Freq: Two times a day (BID) | ORAL | 0 refills | Status: DC

## 2017-06-20 NOTE — Addendum Note (Signed)
Addended by: Crissie SicklesARTER, Jennet Scroggin A on: 06/20/2017 05:59 PM   Modules accepted: Orders

## 2017-06-20 NOTE — Telephone Encounter (Signed)
Patient notified rx was in the front

## 2017-06-20 NOTE — Telephone Encounter (Signed)
Per Dr. Abner GreenspanBlyth she may have all 3 rx this time.  She has been made aware that she has to choose b/t the two xanax and norco. She stated at her next visit she will choose

## 2017-07-03 ENCOUNTER — Encounter: Payer: Self-pay | Admitting: Family Medicine

## 2017-07-21 ENCOUNTER — Telehealth: Payer: Self-pay | Admitting: Family Medicine

## 2017-07-21 NOTE — Telephone Encounter (Signed)
Copied from CRM [id number]. Topic: General - Other >> Jul 21, 2017 12:21 PM Cecelia ByarsGreen, Shaianne Nucci L, RMA wrote: Reason for CRM: medication refill request for Hydrocodone 10-325 mg, please call pt at [phone] when ready for pick up

## 2017-07-28 ENCOUNTER — Encounter: Payer: Self-pay | Admitting: Family Medicine

## 2017-07-28 ENCOUNTER — Ambulatory Visit: Payer: BLUE CROSS/BLUE SHIELD | Admitting: Family Medicine

## 2017-07-28 VITALS — BP 122/86 | HR 56 | Temp 98.0°F | Resp 18 | Wt 141.1 lb

## 2017-07-28 DIAGNOSIS — E782 Mixed hyperlipidemia: Secondary | ICD-10-CM | POA: Diagnosis not present

## 2017-07-28 DIAGNOSIS — M25542 Pain in joints of left hand: Secondary | ICD-10-CM

## 2017-07-28 DIAGNOSIS — I10 Essential (primary) hypertension: Secondary | ICD-10-CM | POA: Diagnosis not present

## 2017-07-28 DIAGNOSIS — Z79899 Other long term (current) drug therapy: Secondary | ICD-10-CM | POA: Diagnosis not present

## 2017-07-28 DIAGNOSIS — R51 Headache: Secondary | ICD-10-CM | POA: Diagnosis not present

## 2017-07-28 DIAGNOSIS — F418 Other specified anxiety disorders: Secondary | ICD-10-CM | POA: Diagnosis not present

## 2017-07-28 DIAGNOSIS — R519 Headache, unspecified: Secondary | ICD-10-CM

## 2017-07-28 DIAGNOSIS — M542 Cervicalgia: Secondary | ICD-10-CM

## 2017-07-28 DIAGNOSIS — M25541 Pain in joints of right hand: Secondary | ICD-10-CM | POA: Diagnosis not present

## 2017-07-28 MED ORDER — CITALOPRAM HYDROBROMIDE 40 MG PO TABS: 20.0000 mg | ORAL_TABLET | Freq: Every day | ORAL | 3 refills | Status: DC

## 2017-07-28 MED ORDER — IBUPROFEN 600 MG PO TABS: 600.0000 mg | ORAL_TABLET | Freq: Four times a day (QID) | ORAL | 2 refills | Status: AC | PRN

## 2017-07-28 MED ORDER — HYDROCODONE-ACETAMINOPHEN 10-325 MG PO TABS: 1.0000 | ORAL_TABLET | Freq: Three times a day (TID) | ORAL | 0 refills | Status: DC | PRN

## 2017-07-28 MED ORDER — BUSPIRONE HCL 5 MG PO TABS: 5.0000 mg | ORAL_TABLET | Freq: Two times a day (BID) | ORAL | 2 refills | Status: DC

## 2017-07-28 MED ORDER — VENLAFAXINE HCL ER 75 MG PO CP24: 150.0000 mg | ORAL_CAPSULE | Freq: Every day | ORAL | 5 refills | Status: DC

## 2017-07-28 MED ORDER — TIZANIDINE HCL 4 MG PO TABS: 4.0000 mg | ORAL_TABLET | Freq: Four times a day (QID) | ORAL | 0 refills | Status: DC | PRN

## 2017-07-28 NOTE — Assessment & Plan Note (Signed)
Well controlled, no changes to meds. Encouraged heart healthy diet such as the DASH diet and exercise as tolerated.  °

## 2017-07-28 NOTE — Progress Notes (Signed)
Subjective:  I acted as a Neurosurgeonscribe for Dr. Abner GreenspanBlyth. Princess, [place]  Patient ID: Suzanne MaladyBonnie Lou Farina, female    DOB: Dec 22, 1955, 61 y.o.   MRN: [id number]  No chief complaint on file.   HPI  Patient is in today for an acute visit and is to follow-up with ongoing anxiety, depression, myalgias, fatigue and most notably pain in her hands.  She has started counseling and finds that helpful but still has ongoing anxiety.  Would like to stop Xanax habituation, does acknowledge anxiety and even some palpitations at times.  No recent febrile illness or hospitalizations.  Is having daily headaches.  No neurologic complaints are otherwise noted. Denies CP/palp/SOB/HA/congestion/fevers/GI or GU c/o. Taking meds as prescribed  Patient Care Team: Bradd CanaryBlyth, Jashad Depaula A, MD as PCP - General (Family Medicine)   Past Medical History:  Diagnosis Date  . Acute bronchitis 08/24/2016  . ADD (attention deficit disorder)   . Allergic conjunctivitis 02/22/2017  . Depression   . Depression with anxiety   . Frequent headaches   . Hyperlipidemia, mixed 09/06/2015  . Hypertension   . Low back pain 04/11/2016  . OAB (overactive bladder)   . Preventative health care 09/06/2015  . Urine incontinence     Past Surgical History:  Procedure Laterality Date  . BREAST BIOPSY  2006    Family History  Problem Relation Age of Onset  . Diabetes Mother   . Colon cancer Mother   . Stroke Mother   . Hypertension Mother   . Colon cancer Sister   . Cancer Sister [id number]       colon  . Diabetes Sister   . Breast cancer Sister   . Cancer Sister        breast  . Breast cancer Maternal Grandmother   . Alcohol abuse Father        cirrhosis  . Diverticulitis Father   . Cancer Sister        cervix  . Heart disease Brother [id number]  . Mental illness Brother        schizophrenia    Social History   Socioeconomic History  . Marital status: Married    Spouse name: Not on file  . Number of children: Not on file  . Years of education: Not  on file  . Highest education level: Not on file  Social Needs  . Financial resource strain: Not on file  . Food insecurity - worry: Not on file  . Food insecurity - inability: Not on file  . Transportation needs - medical: Not on file  . Transportation needs - non-medical: Not on file  Occupational History  . Not on file  Tobacco Use  . Smoking status: Former Smoker    Last attempt to quit: 05/14/1983    Years since quitting: 34.2  . Smokeless tobacco: Never Used  Substance and Sexual Activity  . Alcohol use: Yes  . Drug use: No  . Sexual activity: Yes    Comment: [place] in [place], no major dietary restrictions. eats hi protein minimizes sugar  Other Topics Concern  . Not on file  Social History Narrative  . Not on file    Outpatient Medications Prior to Visit  Medication Sig Dispense Refill  . amphetamine-dextroamphetamine (ADDERALL) 5 MG tablet Take 1 tablet (5 mg total) by mouth 2 (two) times daily with a meal. August 2018 60 tablet 0  . aspirin 81 MG EC tablet Take 81 mg by mouth daily.    . cetirizine (  ZYRTEC) 10 MG tablet Take 10 mg by mouth daily. Reported on 12/08/2015    . Cholecalciferol (VITAMIN D) 2000 UNITS CAPS Take by mouth.    . clobetasol ointment (TEMOVATE) 0.05 % Apply 1 application topically 2 (two) times daily. 30 g 0  . fluticasone (FLONASE) 50 MCG/ACT nasal spray Place 2 sprays into both nostrils daily. 16 g 2  . hydrOXYzine (ATARAX/VISTARIL) 25 MG tablet Take 1 tablet (25 mg total) by mouth every 8 (eight) hours as needed for itching. 30 tablet 3  . LYRICA 75 MG capsule TAKE 1 TO 2 CAPSULES BY MOUTH 3 TIMES A DAY 120 capsule 2  . Multiple Vitamin (MULTIVITAMIN) tablet Take 1 tablet by mouth daily.    . mupirocin cream (BACTROBAN) 2 % Apply 1 application topically 2 (two) times daily. 15 g 1  . Omega-3 Fatty Acids (OMEGA 3 PO) Take by mouth 2 (two) times daily.    Marland Kitchen. oxybutynin (DITROPAN) 5 MG tablet Take 1 tablet (5 mg total) by mouth daily as  needed for bladder spasms. 90 tablet 1  . tobramycin (TOBREX) 0.3 % ophthalmic solution Place 2 drops into the left eye every 6 (six) hours. 5 mL 1  . ALPRAZolam (XANAX) 0.25 MG tablet TAKE 1 TABLET BY MOUTH TWICE A DAY AS NEEDED FOR ANXIETY 40 tablet 0  . citalopram (CELEXA) 40 MG tablet Take 1 tablet (40 mg total) by mouth daily. 30 tablet 3  . HYDROcodone-acetaminophen (NORCO) 10-325 MG tablet Take 1 tablet by mouth every 8 (eight) hours as needed. 60 tablet 0  . ibuprofen (ADVIL,MOTRIN) 600 MG tablet Take 1 tablet (600 mg total) by mouth every 6 (six) hours as needed. 90 tablet 2  . tiZANidine (ZANAFLEX) 4 MG tablet Take 1 tablet (4 mg total) by mouth every 6 (six) hours as needed for muscle spasms. 30 tablet 0  . venlafaxine XR (EFFEXOR XR) 75 MG 24 hr capsule Take 1 capsule (75 mg total) by mouth daily with breakfast. 30 capsule 5   No facility-administered medications prior to visit.     Allergies  Allergen Reactions  . Shellfish Allergy Rash  . Penicillins Itching and Swelling  . Latex Rash    Review of Systems  Constitutional: Positive for malaise/fatigue. Negative for fever.  HENT: Negative for congestion.   Eyes: Negative for blurred vision.  Respiratory: Negative for shortness of breath.   Cardiovascular: Negative for chest pain, palpitations and leg swelling.  Gastrointestinal: Negative for abdominal pain, blood in stool and nausea.  Genitourinary: Negative for dysuria and frequency.  Musculoskeletal: Positive for joint pain. Negative for falls.  Skin: Negative for rash.  Neurological: Positive for headaches. Negative for dizziness and loss of consciousness.  Endo/Heme/Allergies: Negative for environmental allergies.  Psychiatric/Behavioral: Positive for depression. The patient is nervous/anxious.        Objective:    Physical Exam  Constitutional: She is oriented to person, place, and time. She appears well-developed and well-nourished. No distress.  HENT:  Head:  Normocephalic and atraumatic.  Nose: Nose normal.  Eyes: Right eye exhibits no discharge. Left eye exhibits no discharge.  Neck: Normal range of motion. Neck supple.  Cardiovascular: Normal rate and regular rhythm.  No murmur heard. Pulmonary/Chest: Effort normal and breath sounds normal.  Abdominal: Soft. Bowel sounds are normal. There is no tenderness.  Musculoskeletal: She exhibits no edema.  Neurological: She is alert and oriented to person, place, and time.  Skin: Skin is warm and dry.  Psychiatric: She has a normal  mood and affect.  Nursing note and vitals reviewed.   BP 122/86 (BP Location: Left Arm, Patient Position: Sitting, Cuff Size: Normal)   Pulse (!) 56   Temp 98 F (36.7 C) (Oral)   Resp 18   Wt 141 lb 1.6 oz (64 kg)   SpO2 99%   BMI 24.99 kg/m  Wt Readings from Last 3 Encounters:  07/28/17 141 lb 1.6 oz (64 kg)  05/09/17 142 lb 9.6 oz (64.7 kg)  03/03/17 136 lb 4 oz (61.8 kg)   BP Readings from Last 3 Encounters:  07/28/17 122/86  05/09/17 128/82  03/03/17 130/77     Immunization History  Administered Date(s) Administered  . Influenza, Seasonal, Injecte, Preservative Fre 07/02/2014  . Influenza,inj,Quad PF,6+ Mos 05/09/2017  . Tdap 12/20/2011, 08/27/2015    Health Maintenance  Topic Date Due  . COLONOSCOPY  04/03/2007  . PAP SMEAR  05/13/2017  . MAMMOGRAM  08/26/2017  . TETANUS/TDAP  08/26/2025  . INFLUENZA VACCINE  Completed  . Hepatitis C Screening  Completed  . HIV Screening  Completed    Lab Results  Component Value Date   WBC 5.9 08/27/2015   HGB 13.3 08/27/2015   HCT 42.0 08/27/2015   PLT 276.0 08/27/2015   GLUCOSE 83 04/11/2016   CHOL 237 (H) 04/11/2016   TRIG 163.0 (H) 04/11/2016   HDL 66.10 04/11/2016   LDLCALC 138 (H) 04/11/2016   ALT 14 04/11/2016   AST 16 04/11/2016   NA 140 04/11/2016   K 4.0 04/11/2016   CL 103 04/11/2016   CREATININE 0.82 04/11/2016   BUN 7 04/11/2016   CO2 30 04/11/2016   TSH 0.62 08/27/2015     Lab Results  Component Value Date   TSH 0.62 08/27/2015   Lab Results  Component Value Date   WBC 5.9 08/27/2015   HGB 13.3 08/27/2015   HCT 42.0 08/27/2015   MCV 85.4 08/27/2015   PLT 276.0 08/27/2015   Lab Results  Component Value Date   NA 140 04/11/2016   K 4.0 04/11/2016   CO2 30 04/11/2016   GLUCOSE 83 04/11/2016   BUN 7 04/11/2016   CREATININE 0.82 04/11/2016   BILITOT 0.3 04/11/2016   ALKPHOS 66 04/11/2016   AST 16 04/11/2016   ALT 14 04/11/2016   PROT 7.5 04/11/2016   ALBUMIN 4.3 04/11/2016   CALCIUM 9.3 04/11/2016   GFR 91.76 04/11/2016   Lab Results  Component Value Date   CHOL 237 (H) 04/11/2016   Lab Results  Component Value Date   HDL 66.10 04/11/2016   Lab Results  Component Value Date   LDLCALC 138 (H) 04/11/2016   Lab Results  Component Value Date   TRIG 163.0 (H) 04/11/2016   Lab Results  Component Value Date   CHOLHDL 4 04/11/2016   No results found for: HGBA1C       Assessment & Plan:   Problem List Items Addressed This Visit    Frequent headaches    Encouraged increased hydration, 64 ounces of clear fluids daily. Minimize alcohol and caffeine. Eat small frequent meals with lean proteins and complex carbs. Avoid high and low blood sugars. Get adequate sleep, 7-8 hours a night. Needs exercise daily preferably in the morning.      Relevant Medications   HYDROcodone-acetaminophen (NORCO) 10-325 MG tablet   ibuprofen (ADVIL,MOTRIN) 600 MG tablet   citalopram (CELEXA) 40 MG tablet   venlafaxine XR (EFFEXOR XR) 75 MG 24 hr capsule   tiZANidine (ZANAFLEX) 4 MG  tablet   Depression with anxiety    Struggling with ongoing stress will increase the Venlafaxine XR to 150 mg daily. D/c Alprazolam, try Buspar 5 mg bid instead. Has started counseling and finds that helpful      Relevant Medications   citalopram (CELEXA) 40 MG tablet   venlafaxine XR (EFFEXOR XR) 75 MG 24 hr capsule   busPIRone (BUSPAR) 5 MG tablet   Hypertension     Well controlled, no changes to meds. Encouraged heart healthy diet such as the DASH diet and exercise as tolerated.       Neck pain   Relevant Medications   HYDROcodone-acetaminophen (NORCO) 10-325 MG tablet   Hyperlipidemia, mixed    Encouraged heart healthy diet, increase exercise, avoid trans fats, consider a krill oil cap daily      Arthralgia of hands, bilateral    Encouraged to stay active with stress ball use and try topcial rubs if worsens will need referral       Other Visit Diagnoses    High risk medication use    -  Primary   Relevant Orders   Pain Mgmt, Profile 8 w/Conf, U      I have discontinued Vaunda L. Juarbe's ALPRAZolam. I have also changed her citalopram and venlafaxine XR. Additionally, I am having her start on busPIRone. Lastly, I am having her maintain her aspirin, Vitamin D, multivitamin, Omega-3 Fatty Acids (OMEGA 3 PO), cetirizine, mupirocin cream, fluticasone, clobetasol ointment, LYRICA, oxybutynin, hydrOXYzine, tobramycin, amphetamine-dextroamphetamine, HYDROcodone-acetaminophen, ibuprofen, and tiZANidine.  Meds ordered this encounter  Medications  . HYDROcodone-acetaminophen (NORCO) 10-325 MG tablet    Sig: Take 1 tablet by mouth every 8 (eight) hours as needed.    Dispense:  60 tablet    Refill:  0  . ibuprofen (ADVIL,MOTRIN) 600 MG tablet    Sig: Take 1 tablet (600 mg total) by mouth every 6 (six) hours as needed.    Dispense:  90 tablet    Refill:  2  . citalopram (CELEXA) 40 MG tablet    Sig: Take 0.5 tablets (20 mg total) by mouth daily.    Dispense:  15 tablet    Refill:  3  . venlafaxine XR (EFFEXOR XR) 75 MG 24 hr capsule    Sig: Take 2 capsules (150 mg total) by mouth daily with breakfast.    Dispense:  60 capsule    Refill:  5  . tiZANidine (ZANAFLEX) 4 MG tablet    Sig: Take 1 tablet (4 mg total) by mouth every 6 (six) hours as needed for muscle spasms.    Dispense:  30 tablet    Refill:  0  . busPIRone (BUSPAR) 5 MG tablet     Sig: Take 1 tablet (5 mg total) by mouth 2 (two) times daily.    Dispense:  60 tablet    Refill:  2    CMA served as scribe during this visit. History, Physical and Plan performed by medical provider. Documentation and orders reviewed and attested to.  Danise Edge, MD

## 2017-07-28 NOTE — Patient Instructions (Signed)
Lidocaine gel and/or patche, s, [employer] Hot, Aspercreme, Salon Pas Back Pain, Adult Back pain is very common. The pain often gets better over time. The cause of back pain is usually not dangerous. Most people can learn to manage their back pain on their own. Follow these instructions at home: Watch your back pain for any changes. The following actions may help to lessen any pain you are feeling:  Stay active. Start with short walks on flat ground if you can. Try to walk farther each day.  Exercise regularly as told by your doctor. Exercise helps your back heal faster. It also helps avoid future injury by keeping your muscles strong and flexible.  Do not sit, drive, or stand in one place for more than 30 minutes.  Do not stay in bed. Resting more than 1-2 days can slow down your recovery.  Be careful when you bend or lift an object. Use good form when lifting: ? Bend at your knees. ? Keep the object close to your body. ? Do not twist.  Sleep on a firm mattress. Lie on your side, and bend your knees. If you lie on your back, put a pillow under your knees.  Take medicines only as told by your doctor.  Put ice on the injured area. ? Put ice in a plastic bag. ? Place a towel between your skin and the bag. ? Leave the ice on for 20 minutes, 2-3 times a day for the first 2-3 days. After that, you can switch between ice and heat packs.  Avoid feeling anxious or stressed. Find good ways to deal with stress, such as exercise.  Maintain a healthy weight. Extra weight puts stress on your back.  Contact a doctor if:  You have pain that does not go away with rest or medicine.  You have worsening pain that goes down into your legs or buttocks.  You have pain that does not get better in one week.  You have pain at night.  You lose weight.  You have a fever or chills. Get help right away if:  You cannot control when you poop (bowel movement) or pee (urinate).  Your arms or legs feel  weak.  Your arms or legs lose feeling (numbness).  You feel sick to your stomach (nauseous) or throw up (vomit).  You have belly (abdominal) pain.  You feel like you may pass out (faint). This information is not intended to replace advice given to you by your health care provider. Make sure you discuss any questions you have with your health care provider. Document Released: 01/25/2008 Document Revised: 01/14/2016 Document Reviewed: 12/10/2013 Elsevier Interactive Patient Education  [employer]2018 [employer] Inc[employer]

## 2017-08-01 ENCOUNTER — Other Ambulatory Visit: Payer: BLUE CROSS/BLUE SHIELD

## 2017-08-02 DIAGNOSIS — M25542 Pain in joints of left hand: Secondary | ICD-10-CM

## 2017-08-02 DIAGNOSIS — M25541 Pain in joints of right hand: Secondary | ICD-10-CM | POA: Insufficient documentation

## 2017-08-02 NOTE — Assessment & Plan Note (Signed)
Encouraged increased hydration, 64 ounces of clear fluids daily. Minimize alcohol and caffeine. Eat small frequent meals with lean proteins and complex carbs. Avoid high and low blood sugars. Get adequate sleep, 7-8 hours a night. Needs exercise daily preferably in the morning.  

## 2017-08-02 NOTE — Assessment & Plan Note (Addendum)
Struggling with ongoing stress will increase the Venlafaxine XR to 150 mg daily. D/c Alprazolam, try Buspar 5 mg bid instead. Has started counseling and finds that helpful

## 2017-08-02 NOTE — Assessment & Plan Note (Signed)
Encouraged to stay active with stress ball use and try topcial rubs if worsens will need referral

## 2017-08-02 NOTE — Assessment & Plan Note (Signed)
Encouraged heart healthy diet, increase exercise, avoid trans fats, consider a krill oil cap daily 

## 2017-08-08 ENCOUNTER — Ambulatory Visit: Payer: BLUE CROSS/BLUE SHIELD | Admitting: Family Medicine

## 2017-08-11 ENCOUNTER — Other Ambulatory Visit: Payer: Self-pay

## 2017-08-11 MED ORDER — OXYBUTYNIN CHLORIDE 5 MG PO TABS: 5.0000 mg | ORAL_TABLET | Freq: Every day | ORAL | 1 refills | Status: DC | PRN

## 2017-08-30 ENCOUNTER — Other Ambulatory Visit: Payer: Self-pay

## 2017-08-30 MED ORDER — CITALOPRAM HYDROBROMIDE 40 MG PO TABS: 20.0000 mg | ORAL_TABLET | Freq: Every day | ORAL | 3 refills | Status: DC

## 2017-08-31 ENCOUNTER — Other Ambulatory Visit: Payer: Self-pay | Admitting: Internal Medicine

## 2017-08-31 ENCOUNTER — Other Ambulatory Visit: Payer: Self-pay | Admitting: Family Medicine

## 2017-09-05 ENCOUNTER — Ambulatory Visit: Payer: Self-pay | Admitting: *Deleted

## 2017-09-05 NOTE — Telephone Encounter (Signed)
She should stop the medication.    

## 2017-09-05 NOTE — Telephone Encounter (Signed)
Patient states that she started having a rash with itching after she started taking the increased dosing of the Venlafaxine. She states she has a rash over most of her body with horrible itching. She has reduced her dose and she is using benadryl. Offered appointment today( different office)- but she wants to wait until tomorrow due to her work schedule. She is aware if she has increasing symptoms to seek emergency care. Reason for Disposition . [1] Taking new prescription medication AND [2] rash within 4 hours of 1st dose  Answer Assessment - Initial Assessment Questions 1. APPEARANCE of RASH: "Describe the rash." (e.g., spots, blisters, raised areas, skin peeling, scaly)     Fine eczema type bumps  2. SIZE: "How big are the spots?" (e.g., tip of pen, eraser, coin; inches, centimeters)     Pin pricks 3. LOCATION: "Where is the rash located?"     Under neck, between finger, anal and vulvar area, buttock,stomach and back 4. COLOR: "What color is the rash?" (Note: It is difficult to assess rash color in people with darker-colored skin. When this situation occurs, simply ask the caller to describe what they see.)     red 5. ONSET: "When did the rash begin?"     3 days ago 6. FEVER: "Do you have a fever?" If so, ask: "What is your temperature, how was it measured, and when did it start?"     no 7. ITCHING: "Does the rash itch?" If so, ask: "How bad is the itch?" (Scale 1-10; or mild, moderate, severe)     10 8. CAUSE: "What do you think is causing the rash?"     Increased dosing of medication 9. NEW MEDICATION: "What new medication are you taking?" (e.g., name of antibiotic) "When did you start taking this medication?".     Increased Venlafaxine to 37.5 10. OTHER SYMPTOMS: "Do you have any other symptoms?" (e.g., sore throat, fever, joint pain)       no 11. PREGNANCY: "Is there any chance you are pregnant?" "When was your last menstrual period?"       n/a  Protocols used: RASH - WIDESPREAD ON  [id number]

## 2017-09-05 NOTE — Telephone Encounter (Signed)
FYI

## 2017-09-06 ENCOUNTER — Ambulatory Visit: Payer: BLUE CROSS/BLUE SHIELD | Admitting: Medical

## 2017-09-06 ENCOUNTER — Telehealth: Payer: Self-pay | Admitting: Medical

## 2017-09-06 ENCOUNTER — Encounter: Payer: Self-pay | Admitting: Medical

## 2017-09-06 VITALS — BP 98/86 | HR 69 | Temp 98.0°F | Resp 16 | Ht 63.0 in | Wt 144.0 lb

## 2017-09-06 DIAGNOSIS — T7840XA Allergy, unspecified, initial encounter: Secondary | ICD-10-CM

## 2017-09-06 DIAGNOSIS — M542 Cervicalgia: Secondary | ICD-10-CM | POA: Diagnosis not present

## 2017-09-06 MED ORDER — HYDROXYZINE HCL 25 MG PO TABS: 25.0000 mg | ORAL_TABLET | Freq: Three times a day (TID) | ORAL | 0 refills | Status: DC | PRN

## 2017-09-06 MED ORDER — PREDNISONE 10 MG PO TABS: ORAL_TABLET | ORAL | 0 refills | Status: DC

## 2017-09-06 MED ORDER — AMMONIUM LACTATE 12 % EX CREA: TOPICAL_CREAM | CUTANEOUS | 0 refills | Status: AC | PRN

## 2017-09-06 NOTE — Patient Instructions (Signed)
You do appear to have probable allergic reaction.  Increasing the dose of Effexor appears to be the cause presently.  I want you to stay off of Effexro. I will prescribe taper dose of prednisone, hydroxyzine for itching, and Lac-Hydrin for dry skin component.  I will send Dr. Abner GreenspanBlyth a message regarding your desire to get back on the lower dose of Effexor.  Also will see if she has other med options such as Wellbutrin.  Presently continue Celexa.  For your neck pain, I am going to past refill request for your Norco to Dr. Abner GreenspanBlyth as well.  Follow-up in 1-2 weeks or as needed.

## 2017-09-06 NOTE — Telephone Encounter (Signed)
Dr. Abner GreenspanBlyth,  Patient seen today for rash with itching that coincided with increasing Effexor from 37.5-75 mg.  She has stopped Effexor and her rash is still persisting but not severe presently.  I treated her for potential allergic reaction.  I saw in epic no that you advise stopping Effexor completely.  Patient is wondering if she can start back on low-dose Effexor 37.5 mg.  I advised against that presently until I get word from you.  I was thinking maybe he could try Wellbutrin?  She is already on Celexa 40 mg daily.  Also at the very and she wanted a refill of Norco.  I did not want to write that as I did not want her profile show me as a potential multiple provider of controlled medications.  Also not sure if she needed.  Does report some chronic neck pain.  Thanks,  Biochemist, clinicalaguier, Ramon DredgeEdward, PA-C

## 2017-09-06 NOTE — Progress Notes (Signed)
Subjective:    Patient ID: Suzanne Barber, female    DOB: 11/06/1955, 62 y.o.   MRN: [id number]  HPI  Pt in states she has diffuse rash. She has itching on her back, arm pits, under her abdomen, and groin areas.  This has been going on for 5 days.  She states rash seemed to coincide with increase of effexor 37.5 to 75 mg.(she was 37.5 mg for 3 months prior to increasing to 75 mg)  She stopped effexor  2 days ago.  Rash and itching still persists.  Pt is on celexa 40 mg a day. Effexor was add on.  Pt states represcribed lower dose effexor but did not start.  Pt does report that her mood was improved    On review no other suspicious exposures per pt. No shortness of breath or wheezing.    Review of Systems  Constitutional: Negative for chills, fatigue and fever.  Respiratory: Negative for cough, chest tightness, shortness of breath and wheezing.   Cardiovascular: Negative for chest pain and palpitations.  Gastrointestinal: Negative for abdominal pain, blood in stool, constipation, diarrhea, nausea and vomiting.  Skin: Positive for rash.  Hematological: Negative for adenopathy. Does not bruise/bleed easily.  Psychiatric/Behavioral: Negative for behavioral problems, confusion, dysphoric mood and sleep disturbance. The patient is not nervous/anxious.    Past Medical History:  Diagnosis Date  . Acute bronchitis 08/24/2016  . ADD (attention deficit disorder)   . Allergic conjunctivitis 02/22/2017  . Depression   . Depression with anxiety   . Frequent headaches   . Hyperlipidemia, mixed 09/06/2015  . Hypertension   . Low back pain 04/11/2016  . OAB (overactive bladder)   . Preventative health care 09/06/2015  . Urine incontinence      Social History   Socioeconomic History  . Marital status: Married    Spouse name: Not on file  . Number of children: Not on file  . Years of education: Not on file  . Highest education level: Not on file  Social Needs  . Financial  resource strain: Not on file  . Food insecurity - worry: Not on file  . Food insecurity - inability: Not on file  . Transportation needs - medical: Not on file  . Transportation needs - non-medical: Not on file  Occupational History  . Not on file  Tobacco Use  . Smoking status: Former Smoker    Last attempt to quit: 05/14/1983    Years since quitting: 34.3  . Smokeless tobacco: Never Used  Substance and Sexual Activity  . Alcohol use: Yes  . Drug use: No  . Sexual activity: Yes    Comment: married, husband in [place], no major dietary restrictions. eats hi protein minimizes sugar  Other Topics Concern  . Not on file  Social History Narrative  . Not on file    Past Surgical History:  Procedure Laterality Date  . BREAST BIOPSY  2006    Family History  Problem Relation Age of Onset  . Diabetes Mother   . Colon cancer Mother   . Stroke Mother   . Hypertension Mother   . Colon cancer Sister   . Cancer Sister 74       colon  . Diabetes Sister   . Breast cancer Sister   . Cancer Sister        breast  . Breast cancer Maternal Grandmother   . Alcohol abuse Father        cirrhosis  . Diverticulitis Father   .  Cancer Sister        cervix  . Heart disease Brother 1752  . Mental illness Brother        schizophrenia    Allergies  Allergen Reactions  . Shellfish Allergy Rash  . Penicillins Itching and Swelling  . Latex Rash    Current Outpatient Medications on File Prior to Visit  Medication Sig Dispense Refill  . amphetamine-dextroamphetamine (ADDERALL) 5 MG tablet Take 1 tablet (5 mg total) by mouth 2 (two) times daily with a meal. August 2018 60 tablet 0  . aspirin 81 MG EC tablet Take 81 mg by mouth daily.    . busPIRone (BUSPAR) 5 MG tablet Take 1 tablet (5 mg total) by mouth 2 (two) times daily. 60 tablet 2  . cetirizine (ZYRTEC) 10 MG tablet Take 10 mg by mouth daily. Reported on 12/08/2015    . Cholecalciferol (VITAMIN D) 2000 UNITS CAPS Take by mouth.    .  citalopram (CELEXA) 40 MG tablet Take 0.5 tablets (20 mg total) by mouth daily. 15 tablet 3  . clobetasol ointment (TEMOVATE) 0.05 % APPLY TO AFFECTED AREA TWICE A DAY 30 g 0  . fluticasone (FLONASE) 50 MCG/ACT nasal spray Place 2 sprays into both nostrils daily. 16 g 2  . HYDROcodone-acetaminophen (NORCO) 10-325 MG tablet Take 1 tablet by mouth every 8 (eight) hours as needed. 60 tablet 0  . hydrOXYzine (ATARAX/VISTARIL) 25 MG tablet Take 1 tablet (25 mg total) by mouth every 8 (eight) hours as needed for itching. 30 tablet 3  . ibuprofen (ADVIL,MOTRIN) 600 MG tablet Take 1 tablet (600 mg total) by mouth every 6 (six) hours as needed. 90 tablet 2  . LYRICA 75 MG capsule TAKE 1 TO 2 CAPSULES BY MOUTH 3 TIMES A DAY 120 capsule 2  . Multiple Vitamin (MULTIVITAMIN) tablet Take 1 tablet by mouth daily.    . mupirocin cream (BACTROBAN) 2 % Apply 1 application topically 2 (two) times daily. 15 g 1  . Omega-3 Fatty Acids (OMEGA 3 PO) Take by mouth 2 (two) times daily.    Marland Kitchen. oxybutynin (DITROPAN) 5 MG tablet Take 1 tablet (5 mg total) by mouth daily as needed for bladder spasms. 90 tablet 1  . tiZANidine (ZANAFLEX) 4 MG tablet Take 1 tablet (4 mg total) by mouth every 6 (six) hours as needed for muscle spasms. 30 tablet 0  . tobramycin (TOBREX) 0.3 % ophthalmic solution Place 2 drops into the left eye every 6 (six) hours. 5 mL 1  . venlafaxine XR (EFFEXOR XR) 75 MG 24 hr capsule Take 2 capsules (150 mg total) by mouth daily with breakfast. 60 capsule 5  . venlafaxine XR (EFFEXOR-XR) 37.5 MG 24 hr capsule TAKE 1 CAPSULE (37.5 MG TOTAL) BY MOUTH DAILY WITH BREAKFAST. 14 capsule 0   No current facility-administered medications on file prior to visit.     BP 98/86   Pulse 69   Temp 98 F (36.7 C) (Oral)   Resp 16   Ht 5\' 3"  (1.6 m)   Wt 144 lb (65.3 kg)   SpO2 100%   BMI 25.51 kg/m      Objective:   Physical Exam  General- No acute distress. Pleasant patient. Neck- Full range of motion, no  jvd Lungs- Clear, even and unlabored. Heart- regular rate and rhythm. Neurologic- CNII- XII grossly intact. HEENT-normal particularly abnormal bucca mucosa and normal pharynx.  Skin- dry hyperpigmented rash to mid upper back, axilla and below her umbilicus.  Assessment & Plan:  You do appear to have probable allergic reaction.  Increasing the dose of Effexor appears to be the cause presently.  I want you to stay off of Effexro. I will prescribe taper dose of prednisone, hydroxyzine for itching, and Lac-Hydrin for dry skin component.  I will send Dr. Abner Greenspan a message regarding your desire to get back on the lower dose of Effexor.  Also will see if she has other med options such as Wellbutrin.  Presently continue Celexa.  For your neck pain, I am going to past refill request for your Norco to Dr. Abner Greenspan as well.  Follow-up in 1-2 weeks or as needed.  Venice Marcucci, Ramon Dredge, PA-C

## 2017-09-07 ENCOUNTER — Other Ambulatory Visit (INDEPENDENT_AMBULATORY_CARE_PROVIDER_SITE_OTHER): Payer: BLUE CROSS/BLUE SHIELD

## 2017-09-07 DIAGNOSIS — E782 Mixed hyperlipidemia: Secondary | ICD-10-CM | POA: Diagnosis not present

## 2017-09-07 DIAGNOSIS — I10 Essential (primary) hypertension: Secondary | ICD-10-CM | POA: Diagnosis not present

## 2017-09-07 LAB — CBC
HEMATOCRIT: 42.6 % (ref 36.0–46.0)
Hemoglobin: 13.8 g/dL (ref 12.0–15.0)
MCHC: 32.4 g/dL (ref 30.0–36.0)
MCV: 85.6 fl (ref 78.0–100.0)
Platelets: 209 10*3/uL (ref 150.0–400.0)
RBC: 4.97 Mil/uL (ref 3.87–5.11)
RDW: 13.8 % (ref 11.5–15.5)
WBC: 4.4 10*3/uL (ref 4.0–10.5)

## 2017-09-07 LAB — COMPREHENSIVE METABOLIC PANEL
ALT: 10 U/L (ref 0–35)
AST: 18 U/L (ref 0–37)
Albumin: 4 g/dL (ref 3.5–5.2)
Alkaline Phosphatase: 62 U/L (ref 39–117)
BILIRUBIN TOTAL: 0.4 mg/dL (ref 0.2–1.2)
BUN: 11 mg/dL (ref 6–23)
CHLORIDE: 104 meq/L (ref 96–112)
CO2: 32 meq/L (ref 19–32)
CREATININE: 0.87 mg/dL (ref 0.40–1.20)
Calcium: 9.4 mg/dL (ref 8.4–10.5)
GFR: 85.29 mL/min (ref >60.00)
GLUCOSE: 90 mg/dL (ref 70–99)
Potassium: 4 mEq/L (ref 3.5–5.1)
Sodium: 141 mEq/L (ref 135–145)
Total Protein: 7.2 g/dL (ref 6.0–8.3)

## 2017-09-07 LAB — LIPID PANEL
CHOL/HDL RATIO: 3
Cholesterol: 203 mg/dL — ABNORMAL HIGH (ref 0–200)
HDL: 62.4 mg/dL (ref >39.00)
LDL CALC: 127 mg/dL — AB (ref 0–99)
NONHDL: 140.97
TRIGLYCERIDES: 69 mg/dL (ref 0.0–149.0)
VLDL: 13.8 mg/dL (ref 0.0–40.0)

## 2017-09-07 LAB — TSH: TSH: 1.18 u[IU]/mL (ref 0.35–4.50)

## 2017-09-07 NOTE — Telephone Encounter (Signed)
Yes let's avoid effexor and try Wellbutrin XL 150 mg po daily, disp #30 with 2 rf

## 2017-09-08 ENCOUNTER — Telehealth: Payer: Self-pay | Admitting: Medical

## 2017-09-08 ENCOUNTER — Other Ambulatory Visit: Payer: Self-pay

## 2017-09-08 DIAGNOSIS — M542 Cervicalgia: Secondary | ICD-10-CM

## 2017-09-08 MED ORDER — BUPROPION HCL ER (XL) 150 MG PO TB24: 150.0000 mg | ORAL_TABLET | Freq: Every day | ORAL | 2 refills | Status: DC

## 2017-09-08 NOTE — Telephone Encounter (Signed)
Would you check on Monday and see if Dr. Raylene MiyamotoBly responded to do request for Norco.  I am not sure if she has impravata/ability to prescribe from her house now.  I do not know if she is going to be in the office on Monday.  Let me know if if she wrote the prescription this weekend.  If not then I will prescribe her a short course of Norco.

## 2017-09-08 NOTE — Telephone Encounter (Signed)
See my note on pt request for norco. Will you send messag to Dr. Abner GreenspanBlyth and see if she will fill norco.

## 2017-09-08 NOTE — Telephone Encounter (Signed)
I advised pt to use wellbutrin per Dr. Abner GreenspanBlyth instruction. Discussed reasoning why we don't want to use low dose effexor.

## 2017-09-08 NOTE — Telephone Encounter (Signed)
Requesting: NORCO 10-325 MG Contract: 02/21/17 UDS: 02/15/17 Low Risk Last OV: 09/06/17 Next OV: 09/18/17 Last Refill: 07/28/17 #30   Please advise

## 2017-09-08 NOTE — Addendum Note (Signed)
Addended by: Gwenevere AbbotSAGUIER, Teri Legacy M on: 09/08/2017 10:34 AM   Modules accepted: Orders

## 2017-09-08 NOTE — Telephone Encounter (Signed)
Please advise 

## 2017-09-08 NOTE — Telephone Encounter (Signed)
Rx request sent to Dr. Abner GreenspanBlyth

## 2017-09-11 ENCOUNTER — Other Ambulatory Visit: Payer: Self-pay

## 2017-09-11 ENCOUNTER — Telehealth: Payer: Self-pay

## 2017-09-11 ENCOUNTER — Other Ambulatory Visit: Payer: Self-pay | Admitting: Family Medicine

## 2017-09-11 DIAGNOSIS — M542 Cervicalgia: Secondary | ICD-10-CM

## 2017-09-11 DIAGNOSIS — Z79899 Other long term (current) drug therapy: Secondary | ICD-10-CM

## 2017-09-11 MED ORDER — HYDROCODONE-ACETAMINOPHEN 10-325 MG PO TABS: 1.0000 | ORAL_TABLET | Freq: Three times a day (TID) | ORAL | 0 refills | Status: DC | PRN

## 2017-09-11 MED ORDER — HYDROCODONE-ACETAMINOPHEN 10-325 MG PO TABS
1.0000 | ORAL_TABLET | Freq: Three times a day (TID) | ORAL | 0 refills | Status: DC | PRN
Start: 2017-09-11 — End: 2017-09-11

## 2017-09-11 NOTE — Progress Notes (Signed)
pcp is out of the office today Will refill in her absence Indication for chronic opioid: neck pain Medication and dose: norco # pills per month: 60 Last UDS date: 07/28/2017 Pain contract signed (Y/N): yes Date narcotic database last reviewed (include red flags): 02/21/2017

## 2017-09-11 NOTE — Telephone Encounter (Signed)
Thankswilli

## 2017-09-11 NOTE — Telephone Encounter (Signed)
Dr. Laury AxonLowne took care of it pt stated she really needs it

## 2017-09-11 NOTE — Telephone Encounter (Signed)
I tried to do it electronically but not working again she will have to wait til I get back

## 2017-09-11 NOTE — Telephone Encounter (Signed)
Hey, this came out in office did you want to send it electronically?

## 2017-09-11 NOTE — Telephone Encounter (Signed)
I signed her Rx but the eprescribing did not work. Will refill when back in office

## 2017-09-12 ENCOUNTER — Other Ambulatory Visit: Payer: BLUE CROSS/BLUE SHIELD

## 2017-09-12 DIAGNOSIS — Z79899 Other long term (current) drug therapy: Secondary | ICD-10-CM

## 2017-09-13 LAB — PAIN MGMT, PROFILE 8 W/CONF, U
6 Acetylmorphine: NEGATIVE ng/mL (ref <10)
AMPHETAMINES: NEGATIVE ng/mL (ref <500)
Alcohol Metabolites: NEGATIVE ng/mL (ref <500)
BENZODIAZEPINES: NEGATIVE ng/mL (ref <100)
BUPRENORPHINE, URINE: NEGATIVE ng/mL (ref <5)
CREATININE: 21.1 mg/dL
Cocaine Metabolite: NEGATIVE ng/mL (ref <150)
MDMA: NEGATIVE ng/mL (ref <500)
Marijuana Metabolite: NEGATIVE ng/mL (ref <20)
OXIDANT: NEGATIVE ug/mL (ref <200)
Opiates: NEGATIVE ng/mL (ref <100)
Oxycodone: NEGATIVE ng/mL (ref <100)
pH: 7.52 (ref 4.5–9.0)

## 2017-09-18 ENCOUNTER — Encounter: Payer: BLUE CROSS/BLUE SHIELD | Admitting: Family Medicine

## 2017-09-18 DIAGNOSIS — Z0289 Encounter for other administrative examinations: Secondary | ICD-10-CM

## 2017-09-21 ENCOUNTER — Other Ambulatory Visit: Payer: Self-pay

## 2017-09-21 MED ORDER — CITALOPRAM HYDROBROMIDE 40 MG PO TABS: 20.0000 mg | ORAL_TABLET | Freq: Every day | ORAL | 3 refills | Status: DC

## 2017-11-01 ENCOUNTER — Other Ambulatory Visit: Payer: Self-pay

## 2017-11-01 MED ORDER — BUPROPION HCL ER (XL) 150 MG PO TB24: 150.0000 mg | ORAL_TABLET | Freq: Every day | ORAL | 2 refills | Status: DC

## 2017-11-09 ENCOUNTER — Encounter: Payer: BLUE CROSS/BLUE SHIELD | Admitting: Family Medicine

## 2017-11-20 ENCOUNTER — Other Ambulatory Visit: Payer: Self-pay | Admitting: Family Medicine

## 2017-11-20 DIAGNOSIS — M542 Cervicalgia: Secondary | ICD-10-CM

## 2017-11-20 MED ORDER — HYDROCODONE-ACETAMINOPHEN 10-325 MG PO TABS: 1.0000 | ORAL_TABLET | Freq: Three times a day (TID) | ORAL | 0 refills | Status: DC | PRN

## 2017-11-20 NOTE — Telephone Encounter (Signed)
Requesting:Norco Contract:yes UDS:low risk next scfreen 03/13/18 Last OV:09/06/17 Next OV:01/29/18 Last Refill:09/11/17 Database:no concerns   Please advise

## 2017-11-20 NOTE — Telephone Encounter (Signed)
Copied from CRM [phone]. Topic: Quick Communication - Rx Refill/Question >> Nov 20, 2017  9:15 AM Jolayne Hainesaylor, Brittany L wrote: Medication: HYDROcodone-acetaminophen (NORCO) 10-325 MG tablet Has the patient contacted their pharmacy? no (Agent: If no, request that the patient contact the pharmacy for the refill.) Preferred Pharmacy (with phone number or street name): [id number] [place], [place] - [address] Agent: Please be advised that RX refills may take up to 3 business days. We ask that you follow-up with your pharmacy.

## 2017-11-20 NOTE — Telephone Encounter (Signed)
Refill request for controlled medication  Request for  Hydrocodone-acetaminophen  LOV  09/06/17  NOV  01/29/18  Provider:  Danise EdgeStacey Blyth, MD  Last refill:  09/11/17  [id number] [place]  Please review.

## 2017-12-21 ENCOUNTER — Other Ambulatory Visit: Payer: Self-pay | Admitting: Family Medicine

## 2017-12-21 DIAGNOSIS — M542 Cervicalgia: Secondary | ICD-10-CM

## 2017-12-21 MED ORDER — HYDROCODONE-ACETAMINOPHEN 10-325 MG PO TABS
1.0000 | ORAL_TABLET | Freq: Three times a day (TID) | ORAL | 0 refills | Status: DC | PRN
Start: 2017-12-21 — End: 2018-02-05

## 2017-12-21 NOTE — Telephone Encounter (Signed)
Requesting:Norco Contract:yes UDS:low risk next screen 03/13/18 Last OV:09/06/17 Next OV:01/29/18 Last Refill:11/20/17 Database:no concerns   Please advise

## 2017-12-21 NOTE — Telephone Encounter (Signed)
Copied from CRM [id number]. Topic: Quick Communication - Rx Refill/Question >> Dec 21, 2017 12:19 PM Suzanne Barber wrote: Medication: HYDROcodone-acetaminophen (NORCO) 10-325 MG tablet  Has the patient contacted their pharmacy? Yes.   (Agent: If no, request that the patient contact the pharmacy for the refill.) Preferred Pharmacy (with phone number or street name): [id number] - [place], [place] - [address] Agent: Please be advised that RX refills may take up to 3 business days. We ask that you follow-up with your pharmacy.

## 2017-12-25 ENCOUNTER — Ambulatory Visit: Payer: BLUE CROSS/BLUE SHIELD | Admitting: Internal Medicine

## 2017-12-25 ENCOUNTER — Encounter: Payer: Self-pay | Admitting: Internal Medicine

## 2017-12-25 VITALS — BP 124/66 | HR 56 | Temp 98.2°F | Resp 14 | Ht 63.0 in | Wt 149.4 lb

## 2017-12-25 DIAGNOSIS — H01004 Unspecified blepharitis left upper eyelid: Secondary | ICD-10-CM

## 2017-12-25 DIAGNOSIS — H00014 Hordeolum externum left upper eyelid: Secondary | ICD-10-CM | POA: Diagnosis not present

## 2017-12-25 MED ORDER — DOXYCYCLINE HYCLATE 100 MG PO TABS: 100.0000 mg | ORAL_TABLET | Freq: Two times a day (BID) | ORAL | 0 refills | Status: DC

## 2017-12-25 MED ORDER — CIPROFLOXACIN HCL 0.3 % OP SOLN: 2.0000 [drp] | OPHTHALMIC | 0 refills | Status: DC

## 2017-12-25 NOTE — Patient Instructions (Signed)
Use the   new eyedrops every 2 hours while awake  Take antibiotics by mouth as prescribed  Okay to use artificial tears as needed  Cold compresses twice a day  Definitely call if you are not quickly improving in the next 2 to 3 days  Go to the ER if you get worse

## 2017-12-25 NOTE — Progress Notes (Signed)
Subjective:    Patient ID: Suzanne Barber, female    DOB: 03-30-1956, 62 y.o.   MRN: [id number]  DOS:  12/25/2017 Type of visit - description : acute Interval history: Developed a stye a week ago, she is treating it with warm compresses and leftover tobramycin eyedrops. She had a "white pimple", that is gone but has noted that the swelling is getting worse.   Review of Systems No fever chills She has some eye discharge, crusting in the morning. Vision is occasionally blurred.  Past Medical History:  Diagnosis Date  . Acute bronchitis 08/24/2016  . ADD (attention deficit disorder)   . Allergic conjunctivitis 02/22/2017  . Depression   . Depression with anxiety   . Frequent headaches   . Hyperlipidemia, mixed 09/06/2015  . Hypertension   . Low back pain 04/11/2016  . OAB (overactive bladder)   . Preventative health care 09/06/2015  . Urine incontinence     Past Surgical History:  Procedure Laterality Date  . BREAST BIOPSY  2006    Social History   Socioeconomic History  . Marital status: Married    Spouse name: Not on file  . Number of children: Not on file  . Years of education: Not on file  . Highest education level: Not on file  Occupational History  . Not on file  Social Needs  . Financial resource strain: Not on file  . Food insecurity:    Worry: Not on file    Inability: Not on file  . Transportation needs:    Medical: Not on file    Non-medical: Not on file  Tobacco Use  . Smoking status: Former Smoker    Last attempt to quit: 05/14/1983    Years since quitting: 34.6  . Smokeless tobacco: Never Used  Substance and Sexual Activity  . Alcohol use: Yes  . Drug use: No  . Sexual activity: Yes    Comment: married, husband in [place], no major dietary restrictions. eats hi protein minimizes sugar  Lifestyle  . Physical activity:    Days per week: Not on file    Minutes per session: Not on file  . Stress: Not on file  Relationships  . Social  connections:    Talks on phone: Not on file    Gets together: Not on file    Attends religious service: Not on file    Active member of club or organization: Not on file    Attends meetings of clubs or organizations: Not on file    Relationship status: Not on file  . Intimate partner violence:    Fear of current or ex partner: Not on file    Emotionally abused: Not on file    Physically abused: Not on file    Forced sexual activity: Not on file  Other Topics Concern  . Not on file  Social History Narrative  . Not on file      Allergies as of 12/25/2017      Reactions   Shellfish Allergy Rash   Penicillins Itching, Swelling   Latex Rash      Medication List        Accurate as of 12/25/17  7:34 PM. Always use your most recent med list.          ammonium lactate 12 % cream Commonly known as:  LAC-HYDRIN Apply topically as needed for dry skin.   amphetamine-dextroamphetamine 5 MG tablet Commonly known as:  ADDERALL Take 1 tablet (5 mg total)  by mouth 2 (two) times daily with a meal. August 2018   aspirin 81 MG EC tablet Take 81 mg by mouth daily.   buPROPion 150 MG 24 hr tablet Commonly known as:  WELLBUTRIN XL Take 1 tablet (150 mg total) by mouth daily.   busPIRone 5 MG tablet Commonly known as:  BUSPAR Take 1 tablet (5 mg total) by mouth 2 (two) times daily.   cetirizine 10 MG tablet Commonly known as:  ZYRTEC Take 10 mg by mouth daily. Reported on 12/08/2015   ciprofloxacin 0.3 % ophthalmic solution Commonly known as:  CILOXAN Place 2 drops into the left eye every 2 (two) hours while awake. Administer 1 drop, every 2 hours, while awake, for 2 days. Then 1 drop, every 4 hours, while awake, for the next 5 days.   citalopram 40 MG tablet Commonly known as:  CELEXA Take 0.5 tablets (20 mg total) by mouth daily.   clobetasol ointment 0.05 % Commonly known as:  TEMOVATE APPLY TO AFFECTED AREA TWICE A DAY   doxycycline 100 MG tablet Commonly known as:   VIBRA-TABS Take 1 tablet (100 mg total) by mouth 2 (two) times daily.   fluticasone 50 MCG/ACT nasal spray Commonly known as:  FLONASE Place 2 sprays into both nostrils daily.   HYDROcodone-acetaminophen 10-325 MG tablet Commonly known as:  NORCO Take 1 tablet by mouth every 8 (eight) hours as needed.   hydrOXYzine 25 MG tablet Commonly known as:  ATARAX/VISTARIL Take 1 tablet (25 mg total) by mouth every 8 (eight) hours as needed for itching.   ibuprofen 600 MG tablet Commonly known as:  ADVIL,MOTRIN Take 1 tablet (600 mg total) by mouth every 6 (six) hours as needed.   LYRICA 75 MG capsule Generic drug:  pregabalin TAKE 1 TO 2 CAPSULES BY MOUTH 3 TIMES A DAY   multivitamin tablet Take 1 tablet by mouth daily.   mupirocin cream 2 % Commonly known as:  BACTROBAN Apply 1 application topically 2 (two) times daily.   OMEGA 3 PO Take by mouth 2 (two) times daily.   oxybutynin 5 MG tablet Commonly known as:  DITROPAN Take 1 tablet (5 mg total) by mouth daily as needed for bladder spasms.   Vitamin D 2000 units Caps Take by mouth.          Objective:   Physical Exam BP 124/66 (BP Location: Left Arm, Patient Position: Sitting, Cuff Size: Small)   Pulse (!) 56   Temp 98.2 F (36.8 C) (Oral)   Resp 14   Ht  (1.6 m)   Wt 149 lb 6 oz (67.8 kg)   SpO2 96%   BMI 26.46 kg/m  General:   Well developed, well nourished . NAD.  HEENT:  Normocephalic . Face atraumatic EOMI, pupils equal and reactive, anterior chambers symmetric and normal. Mild tearing on the left, conjunctiva not particularly red, no photophobia noted. + Swelling left upper eyelid.  See picture. palpation of the orbit is normal bilaterally. Skin: Not pale. Not jaundice Neurologic:  alert & oriented X3.  Speech normal, gait appropriate for age and unassisted Psych--  Cognition and judgment appear intact.  Cooperative with normal attention span and concentration.  Behavior appropriate. No  anxious or depressed appearing.        Assessment & Plan:    62 year old lady with history that includes depression, anxiety, frequent headaches, high cholesterol, HTN, low back pain, overactive bladder, ADD presents with the following:  Blepharitis, stye: Although the patient has  occasional blurred vision, eye exam other than blepharitis is benign.  Will treat with doxycycline oral, stop tobramycin and start Cipro eyedrops. She knows to quickly call if she is not improving or if she gets worse.  See AVS

## 2017-12-25 NOTE — Progress Notes (Signed)
Pre visit review using our clinic review tool, if applicable. No additional management support is needed unless otherwise documented below in the visit note. 

## 2017-12-29 ENCOUNTER — Ambulatory Visit: Payer: BLUE CROSS/BLUE SHIELD | Admitting: Internal Medicine

## 2017-12-29 ENCOUNTER — Encounter: Payer: Self-pay | Admitting: Internal Medicine

## 2017-12-29 VITALS — BP 132/68 | HR 53 | Temp 97.4°F | Resp 14 | Ht 63.0 in | Wt 149.4 lb

## 2017-12-29 DIAGNOSIS — H00014 Hordeolum externum left upper eyelid: Secondary | ICD-10-CM

## 2017-12-29 NOTE — Progress Notes (Signed)
Subjective:    Patient ID: Suzanne Barber, female    DOB: Mar 27, 1956, 62 y.o.   MRN: [id number]  DOS:  12/29/2017 Type of visit - description : Acute visit Interval history:  Since the last office visit, she is taking the medications correctly. Right eye lid swelling worse.   Review of Systems No fever chills Eyelid this is slightly more painful No visual disturbances per se. No discharge from the eyelid  Past Medical History:  Diagnosis Date  . Acute bronchitis 08/24/2016  . ADD (attention deficit disorder)   . Allergic conjunctivitis 02/22/2017  . Depression   . Depression with anxiety   . Frequent headaches   . Hyperlipidemia, mixed 09/06/2015  . Hypertension   . Low back pain 04/11/2016  . OAB (overactive bladder)   . Preventative health care 09/06/2015  . Urine incontinence     Past Surgical History:  Procedure Laterality Date  . BREAST BIOPSY  2006    Social History   Socioeconomic History  . Marital status: Married    Spouse name: Not on file  . Number of children: Not on file  . Years of education: Not on file  . Highest education level: Not on file  Occupational History  . Not on file  Social Needs  . Financial resource strain: Not on file  . Food insecurity:    Worry: Not on file    Inability: Not on file  . Transportation needs:    Medical: Not on file    Non-medical: Not on file  Tobacco Use  . Smoking status: Former Smoker    Last attempt to quit: 05/14/1983    Years since quitting: 34.6  . Smokeless tobacco: Never Used  Substance and Sexual Activity  . Alcohol use: Yes  . Drug use: No  . Sexual activity: Yes    Comment: married, husband in [place], no major dietary restrictions. eats hi protein minimizes sugar  Lifestyle  . Physical activity:    Days per week: Not on file    Minutes per session: Not on file  . Stress: Not on file  Relationships  . Social connections:    Talks on phone: Not on file    Gets together: Not on file   Attends religious service: Not on file    Active member of club or organization: Not on file    Attends meetings of clubs or organizations: Not on file    Relationship status: Not on file  . Intimate partner violence:    Fear of current or ex partner: Not on file    Emotionally abused: Not on file    Physically abused: Not on file    Forced sexual activity: Not on file  Other Topics Concern  . Not on file  Social History Narrative  . Not on file      Allergies as of 12/29/2017      Reactions   Shellfish Allergy Rash   Penicillins Itching, Swelling   Latex Rash      Medication List        Accurate as of 12/29/17 11:59 PM. Always use your most recent med list.          ammonium lactate 12 % cream Commonly known as:  LAC-HYDRIN Apply topically as needed for dry skin.   amphetamine-dextroamphetamine 5 MG tablet Commonly known as:  ADDERALL Take 1 tablet (5 mg total) by mouth 2 (two) times daily with a meal. August 2018   aspirin 81 MG  EC tablet Take 81 mg by mouth daily.   buPROPion 150 MG 24 hr tablet Commonly known as:  WELLBUTRIN XL Take 1 tablet (150 mg total) by mouth daily.   busPIRone 5 MG tablet Commonly known as:  BUSPAR Take 1 tablet (5 mg total) by mouth 2 (two) times daily.   cetirizine 10 MG tablet Commonly known as:  ZYRTEC Take 10 mg by mouth daily. Reported on 12/08/2015   ciprofloxacin 0.3 % ophthalmic solution Commonly known as:  CILOXAN Place 2 drops into the left eye every 2 (two) hours while awake. Administer 1 drop, every 2 hours, while awake, for 2 days. Then 1 drop, every 4 hours, while awake, for the next 5 days.   citalopram 40 MG tablet Commonly known as:  CELEXA Take 0.5 tablets (20 mg total) by mouth daily.   clobetasol ointment 0.05 % Commonly known as:  TEMOVATE APPLY TO AFFECTED AREA TWICE A DAY   doxycycline 100 MG tablet Commonly known as:  VIBRA-TABS Take 1 tablet (100 mg total) by mouth 2 (two) times daily.     fluticasone 50 MCG/ACT nasal spray Commonly known as:  FLONASE Place 2 sprays into both nostrils daily.   HYDROcodone-acetaminophen 10-325 MG tablet Commonly known as:  NORCO Take 1 tablet by mouth every 8 (eight) hours as needed.   hydrOXYzine 25 MG tablet Commonly known as:  ATARAX/VISTARIL Take 1 tablet (25 mg total) by mouth every 8 (eight) hours as needed for itching.   ibuprofen 600 MG tablet Commonly known as:  ADVIL,MOTRIN Take 1 tablet (600 mg total) by mouth every 6 (six) hours as needed.   LYRICA 75 MG capsule Generic drug:  pregabalin TAKE 1 TO 2 CAPSULES BY MOUTH 3 TIMES A DAY   multivitamin tablet Take 1 tablet by mouth daily.   mupirocin cream 2 % Commonly known as:  BACTROBAN Apply 1 application topically 2 (two) times daily.   OMEGA 3 PO Take by mouth 2 (two) times daily.   oxybutynin 5 MG tablet Commonly known as:  DITROPAN Take 1 tablet (5 mg total) by mouth daily as needed for bladder spasms.   Vitamin D 2000 units Caps Take by mouth.          Objective:   Physical Exam BP 132/68 (BP Location: Right Arm, Patient Position: Sitting, Cuff Size: Small)   Pulse (!) 53   Temp (!) 97.4 F (36.3 C) (Oral)   Resp 14   Ht  (1.6 m)   Wt 149 lb 6 oz (67.8 kg)   SpO2 96%   BMI 26.46 kg/m  General:   Well developed, well nourished . NAD.  HEENT:  Normocephalic.  Upper left eyelid more swollen, slightly TTP, no obvious discharge.  Orbits: No swelling. EOMI. Equal and reactive Skin: Not pale. Not jaundice Neurologic:  alert & oriented X3.  Speech normal, gait appropriate for age and unassisted Psych--  Cognition and judgment appear intact.  Cooperative with normal attention span and concentration.  Behavior appropriate. No anxious or depressed appearing.        Assessment & Plan:   62 year old lady with history that includes depression, anxiety, frequent headaches, high cholesterol, HTN, low back pain, overactive bladder, ADD  presents with the following:  Left blepharitis, , stye: Not responding to doxycycline or eyedrop antibiotics. Needs to  see ophthalmology now, I&d? We were able to secure an visit to the ophthalmology for this afternoon, Dr. Hazle Quant office, appreciate their help.  Patient understood the importance  of keeping the appointment. We will send our records to them.

## 2017-12-29 NOTE — Patient Instructions (Addendum)
Please go to the ophthalmologist office in [facility] [address] [place], [place] [address] [phone]

## 2017-12-29 NOTE — Progress Notes (Signed)
Pre visit review using our clinic review tool, if applicable. No additional management support is needed unless otherwise documented below in the visit note. 

## 2018-01-03 ENCOUNTER — Other Ambulatory Visit: Payer: Self-pay

## 2018-01-03 MED ORDER — CITALOPRAM HYDROBROMIDE 40 MG PO TABS: 20.0000 mg | ORAL_TABLET | Freq: Every day | ORAL | 3 refills | Status: DC

## 2018-01-29 ENCOUNTER — Encounter: Payer: Self-pay | Admitting: Family Medicine

## 2018-01-29 ENCOUNTER — Ambulatory Visit (INDEPENDENT_AMBULATORY_CARE_PROVIDER_SITE_OTHER): Payer: BLUE CROSS/BLUE SHIELD | Admitting: Family Medicine

## 2018-01-29 VITALS — BP 151/92 | HR 53 | Temp 98.5°F | Resp 16 | Ht 61.5 in | Wt 146.0 lb

## 2018-01-29 DIAGNOSIS — I10 Essential (primary) hypertension: Secondary | ICD-10-CM

## 2018-01-29 DIAGNOSIS — F988 Other specified behavioral and emotional disorders with onset usually occurring in childhood and adolescence: Secondary | ICD-10-CM

## 2018-01-29 DIAGNOSIS — Z7289 Other problems related to lifestyle: Secondary | ICD-10-CM | POA: Diagnosis not present

## 2018-01-29 DIAGNOSIS — E782 Mixed hyperlipidemia: Secondary | ICD-10-CM | POA: Diagnosis not present

## 2018-01-29 DIAGNOSIS — Z0000 Encounter for general adult medical examination without abnormal findings: Secondary | ICD-10-CM | POA: Diagnosis not present

## 2018-01-29 DIAGNOSIS — Z1239 Encounter for other screening for malignant neoplasm of breast: Secondary | ICD-10-CM

## 2018-01-29 DIAGNOSIS — F418 Other specified anxiety disorders: Secondary | ICD-10-CM

## 2018-01-29 DIAGNOSIS — M545 Low back pain: Secondary | ICD-10-CM | POA: Diagnosis not present

## 2018-01-29 DIAGNOSIS — H00014 Hordeolum externum left upper eyelid: Secondary | ICD-10-CM

## 2018-01-29 DIAGNOSIS — Z1231 Encounter for screening mammogram for malignant neoplasm of breast: Secondary | ICD-10-CM

## 2018-01-29 MED ORDER — BUPROPION HCL 75 MG PO TABS: 75.0000 mg | ORAL_TABLET | Freq: Two times a day (BID) | ORAL | 0 refills | Status: DC

## 2018-01-29 MED ORDER — VENLAFAXINE HCL ER 37.5 MG PO CP24: 37.5000 mg | ORAL_CAPSULE | Freq: Every day | ORAL | 3 refills | Status: DC

## 2018-01-29 NOTE — Assessment & Plan Note (Signed)
Encouraged moist heat and gentle stretching as tolerated. May try NSAIDs and prescription meds as directed and report if symptoms worsen or seek immediate care 

## 2018-01-29 NOTE — Assessment & Plan Note (Signed)
Felt better on Venlafaxine XR 37.5 mg daily, drop the Wellbutrin and continue Celexa. Takes Buspar prn

## 2018-01-29 NOTE — Assessment & Plan Note (Signed)
Gets a good response to the Adderall but she does feel a little anxious on it at times. She feels it is worth it.

## 2018-01-29 NOTE — Assessment & Plan Note (Signed)
Well controlled, no changes to meds. Encouraged heart healthy diet such as the DASH diet and exercise as tolerated.  °

## 2018-01-29 NOTE — Assessment & Plan Note (Signed)
Encouraged heart healthy diet, increase exercise, avoid trans fats, consider a krill oil cap daily 

## 2018-01-29 NOTE — Patient Instructions (Addendum)
Drop the Wellbutin to 75 mg po daily x 7 days then stop  Shingrix is the new shingles shot 2 shots over 2-6 months at [place] Years, Female Preventive care refers to lifestyle choices and visits with your health care provider that can promote health and wellness. What does preventive care include?  A yearly physical exam. This is also called an annual well check.  Dental exams once or twice a year.  Routine eye exams. Ask your health care provider how often you should have your eyes checked.  Personal lifestyle choices, including: ? Daily care of your teeth and gums. ? Regular physical activity. ? Eating a healthy diet. ? Avoiding tobacco and drug use. ? Limiting alcohol use. ? Practicing safe sex. ? Taking low-dose aspirin daily starting at age 71. ? Taking vitamin and mineral supplements as recommended by your health care provider. What happens during an annual well check? The services and screenings done by your health care provider during your annual well check will depend on your age, overall health, lifestyle risk factors, and family history of disease. Counseling Your health care provider may ask you questions about your:  Alcohol use.  Tobacco use.  Drug use.  Emotional well-being.  Home and relationship well-being.  Sexual activity.  Eating habits.  Work and work Statistician.  Method of birth control.  Menstrual cycle.  Pregnancy history.  Screening You may have the following tests or measurements:  Height, weight, and BMI.  Blood pressure.  Lipid and cholesterol levels. These may be checked every 5 years, or more frequently if you are over 44 years old.  Skin check.  Lung cancer screening. You may have this screening every year starting at age 84 if you have a 30-pack-year history of smoking and currently smoke or have quit within the past 15 years.  Fecal occult blood test (FOBT) of the stool. You may have this test  every year starting at age 56.  Flexible sigmoidoscopy or colonoscopy. You may have a sigmoidoscopy every 5 years or a colonoscopy every 10 years starting at age 3.  Hepatitis C blood test.  Hepatitis B blood test.  Sexually transmitted disease (STD) testing.  Diabetes screening. This is done by checking your blood sugar (glucose) after you have not eaten for a while (fasting). You may have this done every 1-3 years.  Mammogram. This may be done every 1-2 years. Talk to your health care provider about when you should start having regular mammograms. This may depend on whether you have a family history of breast cancer.  BRCA-related cancer screening. This may be done if you have a family history of breast, ovarian, tubal, or peritoneal cancers.  Pelvic exam and Pap test. This may be done every 3 years starting at age 50. Starting at age 59, this may be done every 5 years if you have a Pap test in combination with an HPV test.  Bone density scan. This is done to screen for osteoporosis. You may have this scan if you are at high risk for osteoporosis.  Discuss your test results, treatment options, and if necessary, the need for more tests with your health care provider. Vaccines Your health care provider may recommend certain vaccines, such as:  Influenza vaccine. This is recommended every year.  Tetanus, diphtheria, and acellular pertussis (Tdap, Td) vaccine. You may need a Td booster every 10 years.  Varicella vaccine. You may need this if you have not been vaccinated.  Zoster  vaccine. You may need this after age 106.  Measles, mumps, and rubella (MMR) vaccine. You may need at least one dose of MMR if you were born in 1957 or later. You may also need a second dose.  Pneumococcal 13-valent conjugate (PCV13) vaccine. You may need this if you have certain conditions and were not previously vaccinated.  Pneumococcal polysaccharide (PPSV23) vaccine. You may need one or two doses if you  smoke cigarettes or if you have certain conditions.  Meningococcal vaccine. You may need this if you have certain conditions.  Hepatitis A vaccine. You may need this if you have certain conditions or if you travel or work in places where you may be exposed to hepatitis A.  Hepatitis B vaccine. You may need this if you have certain conditions or if you travel or work in places where you may be exposed to hepatitis B.  Haemophilus influenzae type b (Hib) vaccine. You may need this if you have certain conditions.  Talk to your health care provider about which screenings and vaccines you need and how often you need them. This information is not intended to replace advice given to you by your health care provider. Make sure you discuss any questions you have with your health care provider. Document Released: 09/04/2015 Document Revised: 04/27/2016 Document Reviewed: 06/09/2015 Elsevier Interactive Patient Education  [employer].

## 2018-01-31 DIAGNOSIS — H00019 Hordeolum externum unspecified eye, unspecified eyelid: Secondary | ICD-10-CM | POA: Insufficient documentation

## 2018-01-31 NOTE — Assessment & Plan Note (Signed)
Encouraged to apply hot compresses twice to three times daily and follow up with opthamology if worsens.

## 2018-01-31 NOTE — Assessment & Plan Note (Addendum)
Patient encouraged to maintain heart healthy diet, regular exercise, adequate sleep. Consider daily probiotics. Take medications as prescribed. Has [facility] [facility] [place] in July

## 2018-01-31 NOTE — Progress Notes (Signed)
Patient ID: Suzanne Barber, female   DOB: [birth year], 63 y.o.   MRN: [id number]   Subjective:    Patient ID: Suzanne Barber, female    DOB: 19-Sep-1955, 61 y.o.   MRN: [id number]  Chief Complaint  Patient presents with  . Annual Exam    HPI Patient is in today for annual preventative exam and follow up on chronic medical concerns including depression, chronic pain, hyperlipidemia and hypertension. She has been struggling with swelling and pain in left upper eyelid and has been following with opthamology. She has a colonoscopy scheduled for July with [facility]. Felt better on Venlafaxine XR 37.5 mg daily. She would like to add it back. She notes anhedonia but does not endorse suicidal ideation. Has chronic pain in back and neck. Is trying to maintain a heart healthy diet and stay active. Denies CP/palp/SOB/HA/congestion/fevers/GI or GU c/o. Taking meds as prescribed  Past Medical History:  Diagnosis Date  . Acute bronchitis 08/24/2016  . ADD (attention deficit disorder)   . Allergic conjunctivitis 02/22/2017  . Depression   . Depression with anxiety   . Frequent headaches   . Hyperlipidemia, mixed 09/06/2015  . Hypertension   . Low back pain 04/11/2016  . OAB (overactive bladder)   . Preventative health care 09/06/2015  . Urine incontinence     Past Surgical History:  Procedure Laterality Date  . BREAST BIOPSY  2006    Family History  Problem Relation Age of Onset  . Diabetes Mother   . Colon cancer Mother   . Stroke Mother   . Hypertension Mother   . Colon cancer Sister   . Cancer Sister 2       colon  . Diabetes Sister   . Breast cancer Sister   . Cancer Sister        breast  . Breast cancer Maternal Grandmother   . Alcohol abuse Father        cirrhosis  . Diverticulitis Father   . Cancer Sister        cervix  . Heart disease Brother 24  . Mental illness Brother        schizophrenia    Social History   Socioeconomic History  . Marital status: Married      Spouse name: Not on file  . Number of children: Not on file  . Years of education: Not on file  . Highest education level: Not on file  Occupational History  . Not on file  Social Needs  . Financial resource strain: Not on file  . Food insecurity:    Worry: Not on file    Inability: Not on file  . Transportation needs:    Medical: Not on file    Non-medical: Not on file  Tobacco Use  . Smoking status: Former Smoker    Last attempt to quit: 05/14/1983    Years since quitting: 34.7  . Smokeless tobacco: Never Used  Substance and Sexual Activity  . Alcohol use: Yes  . Drug use: No  . Sexual activity: Yes    Comment: married, husband in [place], no major dietary restrictions. eats hi protein minimizes sugar  Lifestyle  . Physical activity:    Days per week: Not on file    Minutes per session: Not on file  . Stress: Not on file  Relationships  . Social connections:    Talks on phone: Not on file    Gets together: Not on file    Attends religious service:  Not on file    Active member of club or organization: Not on file    Attends meetings of clubs or organizations: Not on file    Relationship status: Not on file  . Intimate partner violence:    Fear of current or ex partner: Not on file    Emotionally abused: Not on file    Physically abused: Not on file    Forced sexual activity: Not on file  Other Topics Concern  . Not on file  Social History Narrative  . Not on file    Outpatient Medications Prior to Visit  Medication Sig Dispense Refill  . ammonium lactate (LAC-HYDRIN) 12 % cream Apply topically as needed for dry skin. 385 g 0  . amphetamine-dextroamphetamine (ADDERALL) 5 MG tablet Take 1 tablet (5 mg total) by mouth 2 (two) times daily with a meal. August 2018 60 tablet 0  . aspirin 81 MG EC tablet Take 81 mg by mouth daily.    . busPIRone (BUSPAR) 5 MG tablet Take 1 tablet (5 mg total) by mouth 2 (two) times daily. 60 tablet 2  . Cholecalciferol (VITAMIN D)  2000 UNITS CAPS Take by mouth.    . citalopram (CELEXA) 40 MG tablet Take 0.5 tablets (20 mg total) by mouth daily. 15 tablet 3  . clobetasol ointment (TEMOVATE) 0.05 % APPLY TO AFFECTED AREA TWICE A DAY 30 g 0  . doxycycline (VIBRA-TABS) 100 MG tablet Take 1 tablet (100 mg total) by mouth 2 (two) times daily. 15 tablet 0  . fluticasone (FLONASE) 50 MCG/ACT nasal spray Place 2 sprays into both nostrils daily. 16 g 2  . HYDROcodone-acetaminophen (NORCO) 10-325 MG tablet Take 1 tablet by mouth every 8 (eight) hours as needed. 60 tablet 0  . hydrOXYzine (ATARAX/VISTARIL) 25 MG tablet Take 1 tablet (25 mg total) by mouth every 8 (eight) hours as needed for itching. 30 tablet 3  . ibuprofen (ADVIL,MOTRIN) 600 MG tablet Take 1 tablet (600 mg total) by mouth every 6 (six) hours as needed. 90 tablet 2  . LYRICA 75 MG capsule TAKE 1 TO 2 CAPSULES BY MOUTH 3 TIMES A DAY 120 capsule 2  . Multiple Vitamin (MULTIVITAMIN) tablet Take 1 tablet by mouth daily.    . mupirocin cream (BACTROBAN) 2 % Apply 1 application topically 2 (two) times daily. 15 g 1  . Omega-3 Fatty Acids (OMEGA 3 PO) Take by mouth 2 (two) times daily.    Marland Kitchen. oxybutynin (DITROPAN) 5 MG tablet Take 1 tablet (5 mg total) by mouth daily as needed for bladder spasms. 90 tablet 1  . buPROPion (WELLBUTRIN XL) 150 MG 24 hr tablet Take 1 tablet (150 mg total) by mouth daily. 30 tablet 2  . cetirizine (ZYRTEC) 10 MG tablet Take 10 mg by mouth daily. Reported on 12/08/2015    . ciprofloxacin (CILOXAN) 0.3 % ophthalmic solution Place 2 drops into the left eye every 2 (two) hours while awake. Administer 1 drop, every 2 hours, while awake, for 2 days. Then 1 drop, every 4 hours, while awake, for the next 5 days. 5 mL 0   No facility-administered medications prior to visit.     Allergies  Allergen Reactions  . Shellfish Allergy Rash  . Penicillins Itching and Swelling  . Latex Rash    Review of Systems  Constitutional: Positive for malaise/fatigue.  Negative for chills and fever.  HENT: Negative for congestion and hearing loss.   Eyes: Negative for discharge.  Respiratory: Negative for cough, sputum  production and shortness of breath.   Cardiovascular: Negative for chest pain, palpitations and leg swelling.  Gastrointestinal: Negative for abdominal pain, blood in stool, constipation, diarrhea, heartburn, nausea and vomiting.  Genitourinary: Negative for dysuria, frequency, hematuria and urgency.  Musculoskeletal: Positive for back pain and myalgias. Negative for falls.  Skin: Negative for rash.  Neurological: Negative for dizziness, sensory change, loss of consciousness, weakness and headaches.  Endo/Heme/Allergies: Negative for environmental allergies. Does not bruise/bleed easily.  Psychiatric/Behavioral: Positive for depression. Negative for suicidal ideas. The patient is not nervous/anxious and does not have insomnia.        Objective:    Physical Exam  Constitutional: She is oriented to person, place, and time. No distress.  HENT:  Head: Normocephalic and atraumatic.  Right Ear: External ear normal.  Left Ear: External ear normal.  Nose: Nose normal.  Mouth/Throat: Oropharynx is clear and moist. No oropharyngeal exudate.  Eyes: Pupils are equal, round, and reactive to light. Conjunctivae are normal. Right eye exhibits no discharge. Left eye exhibits no discharge. No scleral icterus.  Small stye on upper left lid. Mildly erythematous.   Neck: Normal range of motion. Neck supple. No thyromegaly present.  Cardiovascular: Normal rate, regular rhythm, normal heart sounds and intact distal pulses.  No murmur heard. Pulmonary/Chest: Effort normal and breath sounds normal. No respiratory distress. She has no wheezes. She has no rales.  Abdominal: Soft. Bowel sounds are normal. She exhibits no distension and no mass. There is no tenderness.  Musculoskeletal: Normal range of motion. She exhibits no edema or tenderness.    Lymphadenopathy:    She has no cervical adenopathy.  Neurological: She is alert and oriented to person, place, and time. She has normal reflexes. She displays normal reflexes. No cranial nerve deficit. Coordination normal.  Skin: Skin is warm and dry. No rash noted. She is not diaphoretic.    BP (!) 151/92   Pulse (!) 53   Temp 98.5 F (36.9 C) (Oral)   Resp 16   Ht 5' 1.5" (1.562 m)   Wt 146 lb (66.2 kg)   SpO2 100%   BMI 27.14 kg/m  Wt Readings from Last 3 Encounters:  01/29/18 146 lb (66.2 kg)  12/29/17 149 lb 6 oz (67.8 kg)  12/25/17 149 lb 6 oz (67.8 kg)     Lab Results  Component Value Date   WBC 4.4 09/07/2017   HGB 13.8 09/07/2017   HCT 42.6 09/07/2017   PLT 209.0 09/07/2017   GLUCOSE 90 09/07/2017   CHOL 203 (H) 09/07/2017   TRIG 69.0 09/07/2017   HDL 62.40 09/07/2017   LDLCALC 127 (H) 09/07/2017   ALT 10 09/07/2017   AST 18 09/07/2017   NA 141 09/07/2017   K 4.0 09/07/2017   CL 104 09/07/2017   CREATININE 0.87 09/07/2017   BUN 11 09/07/2017   CO2 32 09/07/2017   TSH 1.18 09/07/2017    Lab Results  Component Value Date   TSH 1.18 09/07/2017   Lab Results  Component Value Date   WBC 4.4 09/07/2017   HGB 13.8 09/07/2017   HCT 42.6 09/07/2017   MCV 85.6 09/07/2017   PLT 209.0 09/07/2017   Lab Results  Component Value Date   NA 141 09/07/2017   K 4.0 09/07/2017   CO2 32 09/07/2017   GLUCOSE 90 09/07/2017   BUN 11 09/07/2017   CREATININE 0.87 09/07/2017   BILITOT 0.4 09/07/2017   ALKPHOS 62 09/07/2017   AST 18 09/07/2017   ALT  10 09/07/2017   PROT 7.2 09/07/2017   ALBUMIN 4.0 09/07/2017   CALCIUM 9.4 09/07/2017   GFR 85.29 09/07/2017   Lab Results  Component Value Date   CHOL 203 (H) 09/07/2017   Lab Results  Component Value Date   HDL 62.40 09/07/2017   Lab Results  Component Value Date   LDLCALC 127 (H) 09/07/2017   Lab Results  Component Value Date   TRIG 69.0 09/07/2017   Lab Results  Component Value Date   CHOLHDL  3 09/07/2017   No results found for: HGBA1C     Assessment & Plan:   Problem List Items Addressed This Visit    ADD (attention deficit disorder)    Gets a good response to the Adderall but she does feel a little anxious on it at times. She feels it is worth it.      Depression with anxiety    Felt better on Venlafaxine XR 37.5 mg daily, drop the Wellbutrin and continue Celexa. Takes Buspar prn      Relevant Medications   buPROPion (WELLBUTRIN) 75 MG tablet   venlafaxine XR (EFFEXOR XR) 37.5 MG 24 hr capsule   Hypertension    Well controlled, no changes to meds. Encouraged heart healthy diet such as the DASH diet and exercise as tolerated.       Relevant Orders   CBC   Comprehensive metabolic panel   TSH   Hyperlipidemia, mixed    Encouraged heart healthy diet, increase exercise, avoid trans fats, consider a krill oil cap daily      Relevant Orders   Lipid panel   Preventative health care    Patient encouraged to maintain heart healthy diet, regular exercise, adequate sleep. Consider daily probiotics. Take medications as prescribed. Has colonoscopy scheduled with [facility] in [place] in July      Low back pain    Encouraged moist heat and gentle stretching as tolerated. May try NSAIDs and prescription meds as directed and report if symptoms worsen or seek immediate care      Relevant Orders   Pain Mgmt, Profile 8 w/Conf, U   Hordeolum externum (stye)    Encouraged to apply hot compresses twice to three times daily and follow up with opthamology if worsens.        Other Visit Diagnoses    Other problems related to lifestyle    -  Primary   Relevant Orders   Hepatitis C antibody   Breast cancer screening       Relevant Orders   MM 3D SCREEN BREAST BILATERAL      I have discontinued Liyat L. Speth's buPROPion and ciprofloxacin. I am also having her start on buPROPion and venlafaxine XR. Additionally, I am having her maintain her aspirin, Vitamin D,  multivitamin, Omega-3 Fatty Acids (OMEGA 3 PO), cetirizine, mupirocin cream, fluticasone, LYRICA, hydrOXYzine, amphetamine-dextroamphetamine, ibuprofen, busPIRone, oxybutynin, clobetasol ointment, ammonium lactate, HYDROcodone-acetaminophen, doxycycline, and citalopram.  Meds ordered this encounter  Medications  . buPROPion (WELLBUTRIN) 75 MG tablet    Sig: Take 1 tablet (75 mg total) by mouth 2 (two) times daily.    Dispense:  7 tablet    Refill:  0  . venlafaxine XR (EFFEXOR XR) 37.5 MG 24 hr capsule    Sig: Take 1 capsule (37.5 mg total) by mouth daily with breakfast.    Dispense:  30 capsule    Refill:  3     Danise Edge, MD

## 2018-02-05 ENCOUNTER — Other Ambulatory Visit: Payer: Self-pay | Admitting: Family Medicine

## 2018-02-05 DIAGNOSIS — F988 Other specified behavioral and emotional disorders with onset usually occurring in childhood and adolescence: Secondary | ICD-10-CM

## 2018-02-05 DIAGNOSIS — M542 Cervicalgia: Secondary | ICD-10-CM

## 2018-02-05 MED ORDER — AMPHETAMINE-DEXTROAMPHETAMINE 5 MG PO TABS: 5.0000 mg | ORAL_TABLET | Freq: Two times a day (BID) | ORAL | 0 refills | Status: DC

## 2018-02-05 MED ORDER — HYDROCODONE-ACETAMINOPHEN 10-325 MG PO TABS: 1.0000 | ORAL_TABLET | Freq: Three times a day (TID) | ORAL | 0 refills | Status: DC | PRN

## 2018-02-05 NOTE — Telephone Encounter (Signed)
Copied from CRM [id number]. Topic: Quick Communication - Rx Refill/Question >> Feb 05, 2018 10:41 AM Crist InfanteHarrald, Kathy J wrote: Medication: HYDROcodone-acetaminophen (NORCO) 10-325 MG tablet amphetamine-dextroamphetamine (ADDERALL) 5 MG tablet  Pt seen 01/29/18 and forgot to remind the dr she needs  [id number] [place] [place] - [address] [phone] (Phone) [phone] (Fax)

## 2018-02-05 NOTE — Telephone Encounter (Signed)
Requesting:Adderall & Norco Contract:yes UDS:low risk next screen 03/13/18 Last OV:01/29/18 Next OV:not scheduled  Last Refill: adderall 06/20/17  #60-0rf Norco 12/21/17 #60-0rf Database:   Please advise

## 2018-02-20 ENCOUNTER — Other Ambulatory Visit: Payer: Self-pay | Admitting: Family Medicine

## 2018-02-27 LAB — HM COLONOSCOPY

## 2018-03-02 ENCOUNTER — Encounter: Payer: Self-pay | Admitting: Family Medicine

## 2018-03-09 ENCOUNTER — Other Ambulatory Visit: Payer: Self-pay | Admitting: Family Medicine

## 2018-03-09 DIAGNOSIS — F988 Other specified behavioral and emotional disorders with onset usually occurring in childhood and adolescence: Secondary | ICD-10-CM

## 2018-03-09 DIAGNOSIS — M542 Cervicalgia: Secondary | ICD-10-CM

## 2018-03-09 NOTE — Telephone Encounter (Signed)
Copied from CRM [id number]. Topic: Quick Communication - See Telephone Encounter >> Mar 09, 2018  1:51 PM Terisa Starraylor, Brittany L wrote: CRM for notification. See Telephone encounter for: 03/09/18.  HYDROcodone-acetaminophen (NORCO) 10-325 MG table amphetamine-dextroamphetamine (ADDERALL) 5 MG tablet  [id number] [place], [place] - [address]

## 2018-03-12 NOTE — Telephone Encounter (Signed)
Hydrocodone and Adderall refill Last OV:01/29/18 Last refill: Both 02/05/18 60 tab/0 refill ZOX:WRUEAPCP:[id number] [place] [place] - [address] [phone] (Phone) [phone] (Fax)

## 2018-03-13 MED ORDER — HYDROCODONE-ACETAMINOPHEN 10-325 MG PO TABS: 1.0000 | ORAL_TABLET | Freq: Three times a day (TID) | ORAL | 0 refills | Status: DC | PRN

## 2018-03-13 MED ORDER — AMPHETAMINE-DEXTROAMPHETAMINE 5 MG PO TABS: 5.0000 mg | ORAL_TABLET | Freq: Two times a day (BID) | ORAL | 0 refills | Status: AC

## 2018-03-13 NOTE — Telephone Encounter (Signed)
Requesting:Norco & Adderall  Contract:yes UDS:low risk next screen 03/13/18 Last OV:01/29/18 Next OV:not scheduled  Last Refill:Norco 02/05/18 #60-0rf Adderall 02/05/18 #60-0rf Database:   Please advise

## 2018-03-14 ENCOUNTER — Ambulatory Visit: Payer: BLUE CROSS/BLUE SHIELD | Admitting: Family Medicine

## 2018-03-14 ENCOUNTER — Encounter: Payer: Self-pay | Admitting: Family Medicine

## 2018-03-14 VITALS — BP 130/70 | HR 58 | Temp 98.2°F | Ht 61.0 in | Wt 144.2 lb

## 2018-03-14 DIAGNOSIS — R239 Unspecified skin changes: Secondary | ICD-10-CM

## 2018-03-14 MED ORDER — TRIAMCINOLONE ACETONIDE 0.1 % EX CREA: 1.0000 "application " | TOPICAL_CREAM | Freq: Two times a day (BID) | CUTANEOUS | 0 refills | Status: AC

## 2018-03-14 NOTE — Patient Instructions (Addendum)
Apply triple antibiotic ointment (like Neosporin) twice daily. Keep the area clean and dry.   Things to look out for: increasing pain not relieved by ibuprofen/acetaminophen, fevers, spreading redness, drainage of pus, or foul odor.  Try not to itch.  Ice can help also.  Let Suzanne Barber know if you need anything.

## 2018-03-14 NOTE — Progress Notes (Signed)
Pre visit review using our clinic review tool, if applicable. No additional management support is needed unless otherwise documented below in the visit note. 

## 2018-03-14 NOTE — Progress Notes (Signed)
Chief Complaint  Patient presents with  . Insect Bite    Suzanne Barber is a 62 y.o. female here for a skin complaint.  Duration: 3 days Location: L forearm Pruritic? Yes Painful? Yes Drainage? Yes New soaps/lotions/topicals/detergents? No  Believes something bit her Sick contacts? No Other associated symptoms: Some swelling Therapies tried thus far: Lidocaine, Neosporin, peroxide, alcohol  ROS:  Const: No fevers Skin: As noted in HPI  Past Medical History:  Diagnosis Date  . Acute bronchitis 08/24/2016  . ADD (attention deficit disorder)   . Allergic conjunctivitis 02/22/2017  . Depression   . Depression with anxiety   . Frequent headaches   . Hyperlipidemia, mixed 09/06/2015  . Hypertension   . Low back pain 04/11/2016  . OAB (overactive bladder)   . Preventative health care 09/06/2015  . Urine incontinence    BP 130/70 (BP Location: Left Arm, Patient Position: Sitting, Cuff Size: Normal)   Pulse (!) 58   Temp 98.2 F (36.8 C) (Oral)   Ht 5\' 1"  (1.549 m)   Wt 144 lb 4 oz (65.4 kg)   SpO2 98%   BMI 27.26 kg/m  Gen: awake, alert, appearing stated age Lungs: No accessory muscle use Skin: see below. No drainage, erythema, TTP, fluctuance, excoriation Psych: Age appropriate judgment and insight   L forearm   Skin complaints - Plan: triamcinolone cream (KENALOG) 0.1 %  Orders as above. TAO bid. Keep c/d. Try not to itch. Warning signs and symptoms verbalized and written down in AVS.  F/u prn. The patient voiced understanding and agreement to the plan.  Jilda Rocheicholas Paul [place], DO 03/14/18 2:44 PM

## 2018-03-26 ENCOUNTER — Telehealth: Payer: Self-pay | Admitting: *Deleted

## 2018-03-26 NOTE — Telephone Encounter (Signed)
Received request for Medical records from [employer], forwarded to [place] for email/scan/SLS 08/05    

## 2018-04-02 ENCOUNTER — Telehealth: Payer: Self-pay

## 2018-04-02 NOTE — Telephone Encounter (Signed)
Ok to change sig on lyrica 75 mg to say 1 daily, disp 30 with 5 rf

## 2018-04-02 NOTE — Telephone Encounter (Signed)
Routed to Dr. Abner GreenspanBlyth to advise on changing sig of lyrica and sending in new rx.  Copied from CRM [id number]. Topic: General - Other >> Apr 02, 2018  1:08 PM Gean BirchwoodWilliams-Neal, Sade R wrote: Pt called in for a refill of LYRICA 75 MG capsule. Pt states her script is expired it was last issue 02/2017. Pt states she doesn't take meds as prescribed she only takes one at night.  Offered to schedule appt for pt due to not taking med proberly pt states she was just seen in the office 01/2018 for her physical  Cb# [phone]

## 2018-04-03 MED ORDER — PREGABALIN 75 MG PO CAPS: 75.0000 mg | ORAL_CAPSULE | Freq: Every day | ORAL | 5 refills | Status: DC

## 2018-04-03 NOTE — Telephone Encounter (Signed)
Rx adjusted and printed, awaiting Dr. Mariel AloeBlyth's signature [facility] [place] [place]

## 2018-04-06 NOTE — Telephone Encounter (Signed)
Author phoned pt to ensure pt. Received her new lyrica rx, and pt. Stated she had not. Pt. Also requesting norco refill as well. [facility] [place] to verify if they received it, and they said they do not have lyrica on file.  [facility] Dr. Mariel [facility]oe[facility]'s CMA to address.   Requesting: Norco 10-325mg  every 8 hr prn Contract: 2019 UDS: 09/12/17 Last OV: 03/14/18 Next Ov: n/a Last refill: 03/13/18,  Database: no discrepanies noted  Please advise.   Pt. Would like both lyrica and norco sent to the [facility] [place] [facility].

## 2018-04-06 NOTE — Telephone Encounter (Signed)
thanks

## 2018-04-06 NOTE — Telephone Encounter (Signed)
Re faxed medications over to pharmacy  Spoke with Kathlene NovemberMike he stated they did receive rx

## 2018-04-09 ENCOUNTER — Telehealth: Payer: Self-pay | Admitting: Family Medicine

## 2018-04-09 ENCOUNTER — Other Ambulatory Visit: Payer: Self-pay

## 2018-04-09 DIAGNOSIS — M542 Cervicalgia: Secondary | ICD-10-CM

## 2018-04-09 MED ORDER — CITALOPRAM HYDROBROMIDE 40 MG PO TABS: 20.0000 mg | ORAL_TABLET | Freq: Every day | ORAL | 3 refills | Status: DC

## 2018-04-09 MED ORDER — HYDROCODONE-ACETAMINOPHEN 10-325 MG PO TABS: 1.0000 | ORAL_TABLET | Freq: Three times a day (TID) | ORAL | 0 refills | Status: DC | PRN

## 2018-04-09 NOTE — Telephone Encounter (Unsigned)
Copied from CRM [id number]. Topic: General - Other >> Apr 09, 2018  9:52 AM Gaynelle AduPoole, Shalonda wrote: Reason for CRM:  [facility] HYDROcodone-acetaminophen (NORCO) 10-325 MG tablet 60 tablet 0 04/09/2018  Take 1 tablet by mouth every 8 (eight) hours as needed. - Oral it need to be a hand written prescription, or E-Script.  Please advise

## 2018-04-12 ENCOUNTER — Other Ambulatory Visit: Payer: Self-pay | Admitting: Family Medicine

## 2018-04-12 DIAGNOSIS — M542 Cervicalgia: Secondary | ICD-10-CM

## 2018-04-12 MED ORDER — HYDROCODONE-ACETAMINOPHEN 10-325 MG PO TABS: 1.0000 | ORAL_TABLET | Freq: Three times a day (TID) | ORAL | 0 refills | Status: AC | PRN

## 2018-04-12 NOTE — Telephone Encounter (Signed)
done

## 2018-04-12 NOTE — Telephone Encounter (Signed)
Can you resend medication did not go to pharmacy

## 2018-07-03 ENCOUNTER — Other Ambulatory Visit: Payer: Self-pay | Admitting: Family Medicine

## 2018-07-07 ENCOUNTER — Other Ambulatory Visit: Payer: Self-pay | Admitting: Family Medicine

## 2018-09-19 ENCOUNTER — Encounter: Payer: Self-pay | Admitting: Family

## 2018-09-19 ENCOUNTER — Ambulatory Visit (HOSPITAL_BASED_OUTPATIENT_CLINIC_OR_DEPARTMENT_OTHER)
Admission: RE | Admit: 2018-09-19 | Discharge: 2018-09-19 | Disposition: A | Discharge status: Home / Self Care | Payer: Self-pay | Source: Ambulatory Visit | Attending: Family | Admitting: Family

## 2018-09-19 ENCOUNTER — Ambulatory Visit: Payer: Self-pay | Admitting: Family

## 2018-09-19 ENCOUNTER — Encounter: Payer: Self-pay | Admitting: Family Medicine

## 2018-09-19 VITALS — BP 120/79 | HR 75 | Temp 97.8°F | Resp 16 | Ht 61.0 in | Wt 139.0 lb

## 2018-09-19 DIAGNOSIS — R5383 Other fatigue: Secondary | ICD-10-CM

## 2018-09-19 DIAGNOSIS — R631 Polydipsia: Secondary | ICD-10-CM

## 2018-09-19 DIAGNOSIS — R21 Rash and other nonspecific skin eruption: Secondary | ICD-10-CM

## 2018-09-19 LAB — URINALYSIS, ROUTINE W REFLEX MICROSCOPIC
Bilirubin Urine: NEGATIVE
Ketones, ur: NEGATIVE
Leukocytes, UA: NEGATIVE
NITRITE: NEGATIVE
Specific Gravity, Urine: 1.015 (ref 1.000–1.030)
Total Protein, Urine: NEGATIVE
Urine Glucose: NEGATIVE
Urobilinogen, UA: 0.2 (ref 0.0–1.0)
pH: 6 (ref 5.0–8.0)

## 2018-09-19 LAB — COMPREHENSIVE METABOLIC PANEL
ALT: 12 U/L (ref 0–35)
AST: 15 U/L (ref 0–37)
Albumin: 4.6 g/dL (ref 3.5–5.2)
Alkaline Phosphatase: 57 U/L (ref 39–117)
BILIRUBIN TOTAL: 0.3 mg/dL (ref 0.2–1.2)
BUN: 10 mg/dL (ref 6–23)
CALCIUM: 10.7 mg/dL — AB (ref 8.4–10.5)
CO2: 31 mEq/L (ref 19–32)
CREATININE: 0.88 mg/dL (ref 0.40–1.20)
Chloride: 102 mEq/L (ref 96–112)
GFR: 78.92 mL/min (ref >60.00)
Glucose, Bld: 86 mg/dL (ref 70–99)
Potassium: 4.3 mEq/L (ref 3.5–5.1)
Sodium: 141 mEq/L (ref 135–145)
Total Protein: 7.7 g/dL (ref 6.0–8.3)

## 2018-09-19 LAB — CBC WITH DIFFERENTIAL/PLATELET
BASOS ABS: 0.1 10*3/uL (ref 0.0–0.1)
Basophils Relative: 1.2 % (ref 0.0–3.0)
EOS ABS: 0.1 10*3/uL (ref 0.0–0.7)
Eosinophils Relative: 1.4 % (ref 0.0–5.0)
HEMATOCRIT: 46.2 % — AB (ref 36.0–46.0)
HEMOGLOBIN: 15 g/dL (ref 12.0–15.0)
Lymphocytes Relative: 48.5 % — ABNORMAL HIGH (ref 12.0–46.0)
Lymphs Abs: 2.8 10*3/uL (ref 0.7–4.0)
MCHC: 32.5 g/dL (ref 30.0–36.0)
MCV: 84.9 fl (ref 78.0–100.0)
Monocytes Absolute: 0.5 10*3/uL (ref 0.1–1.0)
Monocytes Relative: 9.2 % (ref 3.0–12.0)
Neutro Abs: 2.3 10*3/uL (ref 1.4–7.7)
Neutrophils Relative %: 39.7 % — ABNORMAL LOW (ref 43.0–77.0)
Platelets: 234 10*3/uL (ref 150.0–400.0)
RBC: 5.44 Mil/uL — AB (ref 3.87–5.11)
RDW: 13.6 % (ref 11.5–15.5)
WBC: 5.7 10*3/uL (ref 4.0–10.5)

## 2018-09-19 LAB — TSH: TSH: 0.87 u[IU]/mL (ref 0.35–4.50)

## 2018-09-19 MED ORDER — CLOBETASOL PROPIONATE 0.05 % EX OINT: TOPICAL_OINTMENT | CUTANEOUS | 0 refills | Status: DC

## 2018-09-19 MED FILL — CLOBETASOL PROP 0.05% OINT: 0.05 | 15 days supply | Qty: 30 | Fill #0

## 2018-09-19 NOTE — Patient Instructions (Addendum)
Please complete lab work prior to leaving. Complete chest x-ray on the first floor. Call if new/worsening symptoms or if you symptoms are not improved in 3-4 days.

## 2018-09-19 NOTE — Progress Notes (Signed)
Subjective:    Patient ID: Suzanne Barber, female    DOB: 21-Feb-1956, 63 y.o.   MRN: [id number]  HPI  Patient is a 63 yr old female who presents today with multiple symptoms. Reports that she had flu like symptoms over christmas.  Reports that she feels severe HA, dizziness, myalgias in her arms especially.  + sweats/chills.  Only mild nasal drainage. She has not taken her temperature at home. + fatigue. Unsure if she has had a fever. Feels like she has been needing to eat and drink more. Sister is diabetic.   Also requesting refill on her steroid cream for skin rash. Review of Systems See HPI  Past Medical History:  Diagnosis Date  . Acute bronchitis 08/24/2016  . ADD (attention deficit disorder)   . Allergic conjunctivitis 02/22/2017  . Depression   . Depression with anxiety   . Frequent headaches   . Hyperlipidemia, mixed 09/06/2015  . Hypertension   . Low back pain 04/11/2016  . OAB (overactive bladder)   . Preventative health care 09/06/2015  . Urine incontinence      Social History   Socioeconomic History  . Marital status: Married    Spouse name: Not on file  . Number of children: Not on file  . Years of education: Not on file  . Highest education level: Not on file  Occupational History  . Not on file  Social Needs  . Financial resource strain: Not on file  . Food insecurity:    Worry: Not on file    Inability: Not on file  . Transportation needs:    Medical: Not on file    Non-medical: Not on file  Tobacco Use  . Smoking status: Former Smoker    Last attempt to quit: 05/14/1983    Years since quitting: 35.3  . Smokeless tobacco: Never Used  Substance and Sexual Activity  . Alcohol use: Yes  . Drug use: No  . Sexual activity: Yes    Comment: married, husband in [place], no major dietary restrictions. eats hi protein minimizes sugar  Lifestyle  . Physical activity:    Days per week: Not on file    Minutes per session: Not on file  . Stress: Not on file    Relationships  . Social connections:    Talks on phone: Not on file    Gets together: Not on file    Attends religious service: Not on file    Active member of club or organization: Not on file    Attends meetings of clubs or organizations: Not on file    Relationship status: Not on file  . Intimate partner violence:    Fear of current or ex partner: Not on file    Emotionally abused: Not on file    Physically abused: Not on file    Forced sexual activity: Not on file  Other Topics Concern  . Not on file  Social History Narrative  . Not on file    Past Surgical History:  Procedure Laterality Date  . BREAST BIOPSY  2006    Family History  Problem Relation Age of Onset  . Diabetes Mother   . Colon cancer Mother   . Stroke Mother   . Hypertension Mother   . Colon cancer Sister   . Cancer Sister 52       colon  . Diabetes Sister   . Breast cancer Sister   . Cancer Sister  breast  . Breast cancer Maternal Grandmother   . Alcohol abuse Father        cirrhosis  . Diverticulitis Father   . Cancer Sister        cervix  . Heart disease Brother 26  . Mental illness Brother        schizophrenia    Allergies  Allergen Reactions  . Shellfish Allergy Rash  . Penicillins Itching and Swelling  . Latex Rash    Current Outpatient Medications on File Prior to Visit  Medication Sig Dispense Refill  . ammonium lactate (LAC-HYDRIN) 12 % cream Apply topically as needed for dry skin. 385 g 0  . amphetamine-dextroamphetamine (ADDERALL) 5 MG tablet Take 1 tablet (5 mg total) by mouth 2 (two) times daily with a meal. June 2019 60 tablet 0  . busPIRone (BUSPAR) 5 MG tablet Take 1 tablet (5 mg total) by mouth 2 (two) times daily. 60 tablet 2  . Cholecalciferol (VITAMIN D) 2000 UNITS CAPS Take by mouth.    . citalopram (CELEXA) 40 MG tablet TAKE 1/2 TABLET BY MOUTH DAILY 45 tablet 1  . clobetasol ointment (TEMOVATE) 0.05 % APPLY TO AFFECTED AREA TWICE A DAY 30 g 0  .  fluticasone (FLONASE) 50 MCG/ACT nasal spray Place 2 sprays into both nostrils daily. 16 g 2  . HYDROcodone-acetaminophen (NORCO) 10-325 MG tablet Take 1 tablet by mouth every 8 (eight) hours as needed. 60 tablet 0  . hydrOXYzine (ATARAX/VISTARIL) 25 MG tablet Take 1 tablet (25 mg total) by mouth every 8 (eight) hours as needed for itching. 30 tablet 3  . ibuprofen (ADVIL,MOTRIN) 600 MG tablet Take 1 tablet (600 mg total) by mouth every 6 (six) hours as needed. 90 tablet 2  . Multiple Vitamin (MULTIVITAMIN) tablet Take 1 tablet by mouth daily.    . Omega-3 Fatty Acids (OMEGA 3 PO) Take by mouth 2 (two) times daily.    Marland Kitchen oxybutynin (DITROPAN) 5 MG tablet Take 1 tablet (5 mg total) by mouth daily as needed for bladder spasms. 90 tablet 1  . pregabalin (LYRICA) 75 MG capsule Take 1 capsule (75 mg total) by mouth daily. 30 capsule 5  . triamcinolone cream (KENALOG) 0.1 % Apply 1 application topically 2 (two) times daily. 30 g 0  . venlafaxine XR (EFFEXOR-XR) 37.5 MG 24 hr capsule TAKE 1 CAPSULE (37.5 MG TOTAL) BY MOUTH DAILY WITH BREAKFAST. 90 capsule 1  . aspirin 81 MG EC tablet Take 81 mg by mouth daily.    Marland Kitchen buPROPion (WELLBUTRIN) 75 MG tablet Take 1 tablet (75 mg total) by mouth 2 (two) times daily. (Patient not taking: Reported on 09/19/2018) 7 tablet 0   No current facility-administered medications on file prior to visit.     BP 120/79 (BP Location: Right Arm, Patient Position: Sitting, Cuff Size: Small)   Pulse 75   Temp 97.8 F (36.6 C) (Oral)   Resp 16   Ht 5\' 1"  (1.549 m)   Wt 139 lb (63 kg)   SpO2 100%   BMI 26.26 kg/m       Objective:   Physical Exam Constitutional:      Appearance: She is well-developed.  Neck:     Musculoskeletal: Neck supple.     Thyroid: No thyromegaly.  Cardiovascular:     Rate and Rhythm: Normal rate and regular rhythm.     Heart sounds: Normal heart sounds. No murmur.  Pulmonary:     Effort: Pulmonary effort is normal. No respiratory distress.  Breath sounds: Normal breath sounds. No wheezing.  Skin:    General: Skin is warm and dry.     Comments: Hyperpigmented rash noted at base of neck  Neurological:     Mental Status: She is alert and oriented to person, place, and time.  Psychiatric:        Behavior: Behavior normal.        Thought Content: Thought content normal.        Judgment: Judgment normal.           Assessment & Plan:  Skin rash- rx refill provided for temovate.  Polydipsia- pt with multiple somatic complaints.  Need to rule out DM2, thyroid dysfunction. Obtain CMET, CBC, TSH.  Viral etiology is another possibility.  She is advised to call if symptoms worsen or if not improved in 3-4 days. Further recommendations following review of lab work and chest x-ray.   Fatigue- check UA/culture and chest x-ray to further evaluate given c/o sweating.

## 2018-09-20 LAB — URINE CULTURE
MICRO NUMBER:: 121673
SPECIMEN QUALITY:: ADEQUATE

## 2018-09-21 ENCOUNTER — Telehealth: Payer: Self-pay | Admitting: Family

## 2018-09-21 NOTE — Telephone Encounter (Signed)
Please let pt know urine culture looks negative for UTI.  Also, her calcium is elevated.  Please ask her to return in 2 weeks for repeat calcium level. Is she taking any calcium supplement? If so please discontinue.

## 2018-09-24 ENCOUNTER — Other Ambulatory Visit: Payer: Self-pay | Admitting: Family Medicine

## 2018-09-25 ENCOUNTER — Other Ambulatory Visit: Payer: Self-pay

## 2018-09-25 NOTE — Telephone Encounter (Signed)
Patient reports she is not taking calcium supplements. Appointment was scheduled to repeat bmet on 10/05/18.

## 2018-10-05 ENCOUNTER — Other Ambulatory Visit: Payer: Self-pay

## 2019-09-06 ENCOUNTER — Encounter: Payer: Self-pay | Admitting: Family

## 2019-09-06 ENCOUNTER — Ambulatory Visit (INDEPENDENT_AMBULATORY_CARE_PROVIDER_SITE_OTHER): Payer: Self-pay | Admitting: Family

## 2019-09-06 ENCOUNTER — Other Ambulatory Visit: Payer: Self-pay

## 2019-09-06 ENCOUNTER — Telehealth: Payer: Self-pay | Admitting: Family Medicine

## 2019-09-06 VITALS — BP 138/93 | HR 105 | Temp 97.3°F | Resp 16 | Ht 62.0 in | Wt 129.0 lb

## 2019-09-06 DIAGNOSIS — R590 Localized enlarged lymph nodes: Secondary | ICD-10-CM

## 2019-09-06 DIAGNOSIS — F419 Anxiety disorder, unspecified: Secondary | ICD-10-CM

## 2019-09-06 DIAGNOSIS — R002 Palpitations: Secondary | ICD-10-CM

## 2019-09-06 DIAGNOSIS — I10 Essential (primary) hypertension: Secondary | ICD-10-CM

## 2019-09-06 DIAGNOSIS — R21 Rash and other nonspecific skin eruption: Secondary | ICD-10-CM

## 2019-09-06 DIAGNOSIS — R0789 Other chest pain: Secondary | ICD-10-CM

## 2019-09-06 DIAGNOSIS — N3281 Overactive bladder: Secondary | ICD-10-CM

## 2019-09-06 DIAGNOSIS — G5600 Carpal tunnel syndrome, unspecified upper limb: Secondary | ICD-10-CM

## 2019-09-06 MED ORDER — OXYBUTYNIN CHLORIDE 5 MG PO TABS: 5.0000 mg | ORAL_TABLET | Freq: Every day | ORAL | 2 refills | Status: DC | PRN

## 2019-09-06 MED ORDER — PREGABALIN 75 MG PO CAPS: 75.0000 mg | ORAL_CAPSULE | Freq: Every day | ORAL | 0 refills | Status: AC

## 2019-09-06 MED ORDER — HYDROXYZINE HCL 25 MG PO TABS: ORAL_TABLET | ORAL | 2 refills | Status: AC

## 2019-09-06 MED ORDER — CITALOPRAM HYDROBROMIDE 10 MG PO TABS: 10.0000 mg | ORAL_TABLET | Freq: Every day | ORAL | 0 refills | Status: AC

## 2019-09-06 MED ORDER — CLOBETASOL PROPIONATE 0.05 % EX OINT: TOPICAL_OINTMENT | CUTANEOUS | 1 refills | Status: AC

## 2019-09-06 NOTE — Telephone Encounter (Signed)
Patient has not receive her antibiotic from Dr Abner Greenspan . Patient is needing this medication as soon as possible

## 2019-09-06 NOTE — Patient Instructions (Addendum)
Restart lyrica for your carpal tunnel pain. Restart temovate cream for rash and zyrtec 10mg  once daily.  Restart citalopram 10mg  once daily. You may use hydroxyzine 3 times daily as needed for itching or anxiety.  Start augmentin for your enlarged gland.  Go the ED if you develop severe/worsening chest pain.

## 2019-09-06 NOTE — Progress Notes (Signed)
Subjective:    Patient ID: Suzanne Barber, female    DOB: September 27, 1955, 64 y.o.   MRN: [id number]  HPI  Patient is a 64 yr old female who presents today with chief complaint of left sided neck pain/swelling and rash.   She reports that she weaned herself off of celexa/venlafaxine due to depression.  She notes that her depression symptoms are currently well controlled, however her anxiety has been worse recently.  She is requesting a refill of Xanax.  OAB- she is taking one tablet twice a week and it helps her at that dose.   Carpal tunnel syndrome-notes increasing pain in her hands.  Had good relief with Lyrica.  Requesting refill.  Anxiety  Palpitations-reports some intermittent brief left-sided chest discomfort.  Wonders if it could be related to stress.  She is recently divorced.   Review of Systems Past Medical History:  Diagnosis Date  . Acute bronchitis 08/24/2016  . ADD (attention deficit disorder)   . Allergic conjunctivitis 02/22/2017  . Depression   . Depression with anxiety   . Frequent headaches   . Hyperlipidemia, mixed 09/06/2015  . Hypertension   . Low back pain 04/11/2016  . OAB (overactive bladder)   . Preventative health care 09/06/2015  . Urine incontinence      Social History   Socioeconomic History  . Marital status: Married    Spouse name: Not on file  . Number of children: Not on file  . Years of education: Not on file  . Highest education level: Not on file  Occupational History  . Not on file  Tobacco Use  . Smoking status: Former Smoker    Quit date: 05/14/1983    Years since quitting: 36.3  . Smokeless tobacco: Never Used  Substance and Sexual Activity  . Alcohol use: Yes  . Drug use: No  . Sexual activity: Yes    Comment: married, husband in [place], no major dietary restrictions. eats hi protein minimizes sugar  Other Topics Concern  . Not on file  Social History Narrative  . Not on file   Social Determinants of Health   Financial  Resource Strain:   . Difficulty of Paying Living Expenses: Not on file  Food Insecurity:   . Worried About Programme researcher, broadcasting/film/video in the Last Year: Not on file  . Ran Out of Food in the Last Year: Not on file  Transportation Needs:   . Lack of Transportation (Medical): Not on file  . Lack of Transportation (Non-Medical): Not on file  Physical Activity:   . Days of Exercise per Week: Not on file  . Minutes of Exercise per Session: Not on file  Stress:   . Feeling of Stress : Not on file  Social Connections:   . Frequency of Communication with Friends and Family: Not on file  . Frequency of Social Gatherings with Friends and Family: Not on file  . Attends Religious Services: Not on file  . Active Member of Clubs or Organizations: Not on file  . Attends Banker Meetings: Not on file  . Marital Status: Not on file  Intimate Partner Violence:   . Fear of Current or Ex-Partner: Not on file  . Emotionally Abused: Not on file  . Physically Abused: Not on file  . Sexually Abused: Not on file    Past Surgical History:  Procedure Laterality Date  . BREAST BIOPSY  2006    Family History  Problem Relation Age of Onset  .  Diabetes Mother   . Colon cancer Mother   . Stroke Mother   . Hypertension Mother   . Colon cancer Sister   . Cancer Sister 59       colon  . Diabetes Sister   . Breast cancer Sister   . Cancer Sister        breast  . Breast cancer Maternal Grandmother   . Alcohol abuse Father        cirrhosis  . Diverticulitis Father   . Cancer Sister        cervix  . Heart disease Brother 63  . Mental illness Brother        schizophrenia    Allergies  Allergen Reactions  . Shellfish Allergy Rash  . Penicillins Itching and Swelling  . Latex Rash    Current Outpatient Medications on File Prior to Visit  Medication Sig Dispense Refill  . ammonium lactate (LAC-HYDRIN) 12 % cream Apply topically as needed for dry skin. 385 g 0  .  amphetamine-dextroamphetamine (ADDERALL) 5 MG tablet Take 1 tablet (5 mg total) by mouth 2 (two) times daily with a meal. June 2019 60 tablet 0  . aspirin 81 MG EC tablet Take 81 mg by mouth daily.    . busPIRone (BUSPAR) 5 MG tablet Take 1 tablet (5 mg total) by mouth 2 (two) times daily. 60 tablet 2  . Cholecalciferol (VITAMIN D) 2000 UNITS CAPS Take by mouth.    . clobetasol ointment (TEMOVATE) 0.05 % APPLY TO AFFECTED AREA TWICE A DAY 30 g 0  . fluticasone (FLONASE) 50 MCG/ACT nasal spray Place 2 sprays into both nostrils daily. 16 g 2  . HYDROcodone-acetaminophen (NORCO) 10-325 MG tablet Take 1 tablet by mouth every 8 (eight) hours as needed. 60 tablet 0  . hydrOXYzine (ATARAX/VISTARIL) 25 MG tablet Take 1 tablet (25 mg total) by mouth every 8 (eight) hours as needed for itching. 30 tablet 3  . ibuprofen (ADVIL,MOTRIN) 600 MG tablet Take 1 tablet (600 mg total) by mouth every 6 (six) hours as needed. 90 tablet 2  . Multiple Vitamin (MULTIVITAMIN) tablet Take 1 tablet by mouth daily.    . Omega-3 Fatty Acids (OMEGA 3 PO) Take by mouth 2 (two) times daily.    Marland Kitchen oxybutynin (DITROPAN) 5 MG tablet Take 1 tablet (5 mg total) by mouth daily as needed for bladder spasms. 90 tablet 1  . triamcinolone cream (KENALOG) 0.1 % Apply 1 application topically 2 (two) times daily. 30 g 0  . citalopram (CELEXA) 40 MG tablet TAKE 1/2 TABLET BY MOUTH DAILY (Patient not taking: Reported on 09/06/2019) 45 tablet 1  . pregabalin (LYRICA) 75 MG capsule Take 1 capsule (75 mg total) by mouth daily. (Patient not taking: Reported on 09/06/2019) 30 capsule 5   No current facility-administered medications on file prior to visit.    BP (!) 138/93 (BP Location: Right Arm, Patient Position: Sitting, Cuff Size: Small)   Pulse (!) 105   Temp (!) 97.3 F (36.3 C) (Temporal)   Resp 16   Ht 5\' 2"  (1.575 m)   Wt 129 lb (58.5 kg)   SpO2 99%   BMI 23.59 kg/m       Objective:   Physical Exam Constitutional:       Appearance: She is well-developed.  HENT:     Head: Normocephalic and atraumatic.  Cardiovascular:     Rate and Rhythm: Normal rate and regular rhythm.     Heart sounds: Normal heart sounds. No  murmur.  Pulmonary:     Effort: Pulmonary effort is normal. No respiratory distress.     Breath sounds: Normal breath sounds. No wheezing.  Lymphadenopathy:     Comments: + tender left anterior cervical lymph node (mid chain) approximately 1.5 cm with another slightly smaller tender lymph node posterior to this  Skin:    Comments: Fine maculopapular rash noted on anterior neck and upper chest  Psychiatric:        Behavior: Behavior normal.        Thought Content: Thought content normal.        Judgment: Judgment normal.           Assessment & Plan:  Cervical lymphadenopathy-advised patient to begin Augmentin.  We will plan to bring her back in 2 weeks for follow-up.  If symptoms worsen or if they fail to improve after completing Augmentin she will need further neck imaging.  Atypical chest pain-EKG tracing is personally reviewed.  EKG notes NSR.  No acute changes.  I suggested that she complete a stress test, however she is currently uninsured and she declines at this time due to cost.  She is advised to go to the emergency department should she develop severe worsening chest pain.  Patient verbalizes understanding.  Skin rash-advised patient to add Zyrtec 10 mg by mouth once daily and apply clobetasol ointment twice daily as needed.  Anxiety-this is currently uncontrolled.  She had previously been on Effexor and citalopram.  I have advised her to restart citalopram 10 mg once daily.  I have also refilled her Atarax and advised her that this can be used for severe itching or anxiety.  Hypertension-she notes that she has had some high readings at home.  Blood pressure looks fair today.  Will plan to repeat blood pressure at her 2-week follow-up.  Carpal tunnel syndrome-refill provided of  Lyrica.  She will need to have a controlled substance contract updated and urine drug screen next visit.  Overactive bladder-stable with as needed use of Ditropan.  This visit occurred during the SARS-CoV-2 public health emergency.  Safety protocols were in place, including screening questions prior to the visit, additional usage of staff PPE, and extensive cleaning of exam room while observing appropriate contact time as indicated for disinfecting solutions.

## 2019-09-09 NOTE — Telephone Encounter (Signed)
Have not seen or spoken to her recently. Need to know what her symptoms are so I can assess if I can prescribe an antibiotic.

## 2019-09-09 NOTE — Telephone Encounter (Signed)
Please advise 

## 2019-09-10 MED ORDER — DOXYCYCLINE HYCLATE 100 MG PO TABS: 100.0000 mg | ORAL_TABLET | Freq: Two times a day (BID) | ORAL | 0 refills | Status: AC

## 2019-09-10 MED ORDER — EPINEPHRINE 0.3 MG/0.3ML IJ SOAJ: 0.3000 mg | INTRAMUSCULAR | 0 refills | Status: AC | PRN

## 2019-09-10 MED ORDER — PREDNISONE 10 MG PO TABS: ORAL_TABLET | ORAL | 0 refills | Status: DC

## 2019-09-10 NOTE — Telephone Encounter (Signed)
Pt called back in to follow up on previous message. Pt says that she seen Melissa and was told that she would send in a antibiotic.   Pt says that she would also like to know if provider would send in a Rx for her allergy to shell fish? Pt says that she has broken out in hives      Pharmacy: [facility].    Please advise.

## 2019-09-10 NOTE — Telephone Encounter (Signed)
Spoke to pt.  She reports + hives. Last night thinks that she may have accidentally come in contact with shellfish.  Will send rx for doxycycline for her enlarged lymph node. pred taper for hives + atarax or zyrtec prn. Epi pen if she develops tongue/lip swelling or SOB. Pt is advised to go to the ER if she develops severe/worsening symptoms.

## 2019-09-10 NOTE — Telephone Encounter (Signed)
Please see message below.  Please advise

## 2019-09-20 ENCOUNTER — Ambulatory Visit: Payer: Self-pay | Admitting: Family

## 2019-09-20 DIAGNOSIS — Z0289 Encounter for other administrative examinations: Secondary | ICD-10-CM

## 2019-09-28 ENCOUNTER — Other Ambulatory Visit: Payer: Self-pay | Admitting: Family

## 2019-10-03 ENCOUNTER — Telehealth: Payer: Self-pay | Admitting: Family Medicine

## 2019-10-03 NOTE — Telephone Encounter (Signed)
Caller Name: anitra, doxtater Phone: [phone]  Medication: predniSONE (DELTASONE) 10 MG tablet  Pt has a rash still on the palm of her hand. It is improved since visit 1/15 with Melissa. Pt is moving and needing relief from the itching/bubbles on her hand.   Has the patient contacted their pharmacy? Yes - told to call MD  Preferred Pharmacy (with phone number or street name):  [id number] [place], [place] - [address] Phone:  [phone]  Fax:  [phone]

## 2019-10-04 ENCOUNTER — Other Ambulatory Visit: Payer: Self-pay | Admitting: Family Medicine

## 2019-10-04 MED ORDER — PREDNISONE 10 MG PO TABS: ORAL_TABLET | ORAL | 0 refills | Status: AC

## 2019-10-04 NOTE — Telephone Encounter (Signed)
I sent in a refill on the Prednisone but if it does not give an adequate response she will need to set up a virtual visit or in person if she prefers to discuss next steps

## 2019-10-04 NOTE — Telephone Encounter (Signed)
Patient requesting another refill on Prednisone for her rash. Skin rash better and dry but still itching and not able to rest.

## 2019-10-21 ENCOUNTER — Ambulatory Visit: Payer: Self-pay | Admitting: Family Medicine

## 2019-11-28 ENCOUNTER — Other Ambulatory Visit: Payer: Self-pay | Admitting: Family
